# Patient Record
Sex: Female | Born: 1985 | Race: White | Hispanic: No | Marital: Married | Smoking: Never smoker
Health system: Southern US, Community
[De-identification: ages and names within clinical notes are randomized; demographics above are authoritative.]

## PROBLEM LIST (undated history)

## (undated) DIAGNOSIS — N200 Calculus of kidney: Secondary | ICD-10-CM

## (undated) HISTORY — PX: CHOLECYSTECTOMY: SHX55

---

## 2007-07-19 ENCOUNTER — Ambulatory Visit: Payer: Self-pay | Admitting: Family Medicine

## 2008-05-04 ENCOUNTER — Inpatient Hospital Stay: Payer: Self-pay

## 2010-08-31 ENCOUNTER — Ambulatory Visit: Payer: Self-pay | Admitting: Internal Medicine

## 2012-01-27 ENCOUNTER — Ambulatory Visit: Payer: Self-pay | Admitting: Emergency Medicine

## 2012-01-27 LAB — PREGNANCY, URINE: Pregnancy Test, Urine: NEGATIVE m[IU]/mL

## 2012-06-10 ENCOUNTER — Ambulatory Visit: Payer: Self-pay | Admitting: Family Medicine

## 2012-06-12 ENCOUNTER — Ambulatory Visit: Payer: Self-pay

## 2012-06-17 ENCOUNTER — Ambulatory Visit: Payer: Self-pay | Admitting: Family Medicine

## 2012-07-07 ENCOUNTER — Ambulatory Visit: Payer: Self-pay | Admitting: Family Medicine

## 2013-02-12 DIAGNOSIS — N200 Calculus of kidney: Secondary | ICD-10-CM

## 2013-02-12 HISTORY — DX: Calculus of kidney: N20.0

## 2013-09-20 ENCOUNTER — Ambulatory Visit: Payer: Self-pay | Admitting: Physician Assistant

## 2013-09-20 LAB — RAPID STREP-A WITH REFLX: Micro Text Report: POSITIVE

## 2013-11-19 ENCOUNTER — Ambulatory Visit: Payer: Self-pay

## 2013-11-19 LAB — CBC WITH DIFFERENTIAL/PLATELET
BASOS ABS: 0.1 10*3/uL (ref 0.0–0.1)
Basophil %: 0.8 %
EOS ABS: 0.2 10*3/uL (ref 0.0–0.7)
Eosinophil %: 1.7 %
HCT: 44.9 % (ref 35.0–47.0)
HGB: 15 g/dL (ref 12.0–16.0)
Lymphocyte #: 2.6 10*3/uL (ref 1.0–3.6)
Lymphocyte %: 24.1 %
MCH: 29.4 pg (ref 26.0–34.0)
MCHC: 33.4 g/dL (ref 32.0–36.0)
MCV: 88 fL (ref 80–100)
MONO ABS: 0.8 x10 3/mm (ref 0.2–0.9)
MONOS PCT: 7.7 %
Neutrophil #: 7.1 10*3/uL — ABNORMAL HIGH (ref 1.4–6.5)
Neutrophil %: 65.7 %
Platelet: 235 10*3/uL (ref 150–440)
RBC: 5.11 10*6/uL (ref 3.80–5.20)
RDW: 13.7 % (ref 11.5–14.5)
WBC: 10.8 10*3/uL (ref 3.6–11.0)

## 2013-11-19 LAB — RAPID STREP-A WITH REFLX: MICRO TEXT REPORT: NEGATIVE

## 2013-11-19 LAB — MONONUCLEOSIS SCREEN: MONO TEST: NEGATIVE

## 2013-11-21 LAB — BETA STREP CULTURE(ARMC)

## 2014-02-12 HISTORY — PX: KIDNEY STONE SURGERY: SHX686

## 2014-04-03 ENCOUNTER — Inpatient Hospital Stay: Payer: Self-pay | Admitting: Internal Medicine

## 2014-04-21 ENCOUNTER — Ambulatory Visit: Payer: Self-pay | Admitting: Urology

## 2014-06-13 NOTE — Op Note (Signed)
PATIENT NAME:  Melissa Nunez, Melissa Nunez MR#:  [id number] DATE OF BIRTH:  1985/04/13  DATE OF PROCEDURE:  04/21/2014  PREOPERATIVE DIAGNOSIS: Right proximal ureteral stone x 2.   POSTOPERATIVE DIAGNOSIS: Right proximal ureteral stone x 2.   PROCEDURE: Right ureteroscopy, laser lithotripsy, right ureteral stent exchange.   ATTENDING SURGEON: Claris Gladden, MD   ANESTHESIA: General anesthesia.   ESTIMATED BLOOD LOSS: Minimal.   DRAINS: A 6 x 24 French double-J ureteral stent on right with string in place.   SPECIMENS: Stone fragment.   COMPLICATIONS: None.   INDICATION: This is a 29 year old female who presented with acute onset right flank pain, found to have 7 and 5 mm proximal ureteral stones. She underwent urgent ureteral stent placement. She returns today to the operating room for definitive management of her stones. Risks and benefits of the procedure were explained in detail. The patient agreed to proceed as planned.   PROCEDURE: The patient was correctly identified in the preoperative holding area and informed consent was confirmed. She was brought to the operating suite and placed on the table in a supine position. At this time, universal timeout protocol was performed. All team members were identified. Venodyne boots were placed and she was administered IV ampicillin and gentamicin in the perioperative period. She was then repositioned lower on the bed in the dorsal lithotomy position and prepped and draped in surgical fashion. A rigid cystoscope using a 22 French access sheath was advanced per urethra into the bladder. Attention was turned to the right ureteral orifice from which a fairly encrusted right ureteral stent was seen emanating. The distal coil of the stent was grasped using stent graspers and brought out to the level of the urethral meatus. This was then cannulated using a Sensor wire up to the level of the kidney. The stent was removed leaving the wire in place. The wire was  snapped in place with a safety wire. Given the ureteral location of the stone, I did first attempt to use a rigid ureteroscope up to the level of the mid to proximal ureter, but I was unable to pass the scope up to the level of the kidney; therefore, a second wire was placed through the ureteroscope. A 7 French flexible ureteroscope was placed up to the level of the proximal ureter. It appears that the stone and been pushed into the mid pole calyx during this maneuver and 2 stones could be seen, one of the renal pelvis and one in the mid pole calyx. A 365 micron laser fiber was then brought in using the settings of 0.8 joules and 12 Hz. The larger of the 2 stones in the mid pole calyx was fragmented into very, very small dust-like particles. The 5 mm stone was identified in the renal pelvis and using a 1.9 French tipless Nitinol basket, this was brought out in one piece dilate the ureter without difficulty. This was passed off as stone fragment. The flexible ureteroscope was then reintroduced back up to the level of the renal pelvis after reintroducing a second working wire. Care was taken to directly visualize each and every calyx and there was no significant stone burden remaining, so small fragments in the mid pole calyx were basketed out, this time, carefully inspecting the entire length of the ureter upon removal of the scope to ensure that there were no residual stone fragments. A 6 x 24 French double-J ureteral stent was then placed over the safety wire to the level of the renal pelvis.  The wire was partially withdrawn and a coil was noted within the renal pelvis. The wire was then fully withdrawn and a coil was noted within the bladder. The string was left on the stent. The patient was cleaned and dried. The stent string was carefully secured to the patient's left inner thigh using Mastisol and Tegaderm. She was repositioned in the supine position, reversed from anesthesia, and taken to the PACU in stable  condition.   PLAN: The patient was given instructions to remove her stent in approximately 4 days. She will follow up in our office in 4 weeks with renal ultrasound prior.     ____________________________ Claris GladdenAshley J. Solana Coggin, MD ajb:bm D: 04/21/2014 12:54:35 ET T: 04/21/2014 22:54:12 ET JOB#: [id number]  cc: Claris GladdenAshley J. Money Mckeithan, MD, <Dictator> Claris GladdenASHLEY J Alexza Norbeck MD ELECTRONICALLY SIGNED 05/25/2014 17:42

## 2014-06-13 NOTE — Consult Note (Signed)
CONSULTING SERVICE: EMERGENCY ROOM AND MEDICINE   REASON FOR CONSULTATION: RIGHT PROXIMAL URETERAL STONES  OF PRESENT ILLNESS: Mrs. Melissa Nunez is a pleasant 29 YO woman who was admitted from the ED to the medicine service on 04/03/2014 when she presented with worsening pain, n/v, malaise. Bloodwork demonstrated a WBC of 13.4 and UA had some suggesstion of the presence of a UTI. CT demonstrated right proximal ureteral stone burden with hydro. SCr normal. Given concern for obstruction and infection, patient admitted to medicine service and urology was consulted for further evaluation. 04/04/14 AM, patient appears comfortable and stable. She states she has never had kidney stones before.  MEDICAL HISTORY: None.  HISTORY: None.  None.  None.  HISTORY: Grandmother has hypertension. Aunt with stones. CAD is present in the family.  HISTORY: Married, lives with husband and child. Social ETOH use. No tabacco use currently.  OF SYSTEMS: Per HPI.  EXAMINATION: mood & affect, accompanied by her husband EOMI, PERRL respirations ND, NT, no guarding/rebound, obese right > left flank tenderness, intermittently worse  WBC 13.4 on admission, downtrened to 10 after admission. SCr within normal limits. UA demosntrates presence of WBC, RBCs, and trace bacteria. H&H normal.  CT abdomen/pelvis personally reviewed. RIGHT proximal ureteral stones present with associated RIGHT hydronephrosis and a small amount of perinephric standing.  29 YO woman with right proximal ureteral stone burden. Overall, stone burden is unlikely to pass with medical expulsive therapy. UA and bloodwork demonstrates some concern for obstruction and infection. CT demonstrates moderate right hydro proximal to stone.  Given overall, clinical picture, I recommended moving forward with cysto, RPG, and RIGHT ureteral stent placement. Discussed risks/benefits including possibility of severe stone impaction preventing placement of stone. In this event, I would  recommend a nephrostomy tube. Also discussed possibility of sepsis leading to ICU level care and the remote risks of PE, MI, CVA, and death. Discussed absolute need for patient to f/u with urology to have stone treated and stent ultimately removed as an ignored stent can lead to renal loss and severe complications. She and her husband endorse understanding. All questions and concerns were addressed to their satisfaction. Informed consent signed and witnessed. Patient is eager to move forward.    Electronic Signatures for Addendum Section: Angelina PihKurpad, Vi Biddinger R (MD)  (Signed Addendum 21-Feb-16 12:11) Patient doing well after stent placement. I again reiterated the importance of f/u with patient and her husband for treatment of stone and stent removal. She should f/u with [facility]. [facility]anna ScotlandAshley Brandon [facility].   Electronic Signatures: Angelina PihKurpad, Lejon Afzal R (MD) (Signed on 21-Feb-16 10:50)  Authored   Last Updated: 21-Feb-16 12:11 by Angelina PihKurpad, Haziel Molner R (MD)

## 2014-06-13 NOTE — Discharge Summary (Signed)
PATIENT NAME:  Melissa Nunez, Melissa Nunez DATE OF BIRTH:  1985-09-03  DATE OF ADMISSION:  04/03/2014 DATE OF DISCHARGE:  04/05/2014  ADMITTING DIAGNOSES: Hydronephrosis and kidney stones.   DISCHARGE DIAGNOSES:  1.  Right proximal ureteral stone status post cystourethroscopy, right JJ ureteral stent placement, right retrograde pyelogram on 04/04/2014 by Dr. Domenick BookbinderKurpad. 2.  Right-sided hydronephrosis due to kidney stone.  3.  Pyuria likely urinary tract infection, culture is negative.  4.  Leukocytosis.  5.  Obesity with BMI of 44.7.   DISCHARGE CONDITION: Stable.   DISCHARGE MEDICATIONS: The patient is to continue hydrocodone with Tylenol 325 mg/5 mg 1 tablet every 4 hours as needed, Keflex 500 mg every 8 hours for 8 more days.   HOME OXYGEN: None.  TREATMENT:  The patient was advised to strain urine and save stone.   DIET:  Regular.  The patient was advised to stay hydrated, regular consistency.   ACTIVITY LIMITATIONS: As tolerated.    FOLLOWUP APPOINTMENTS: With Dr. Vanna ScotlandAshley Brandon, urology, in 1 week after discharge and also her primary care physician, Dr. Maryjane HurterFeldpausch in 2-3 days after discharge.  CONSULTANTS: Care management, social work, Dr. Domenick BookbinderKurpad.   RADIOLOGIC STUDIES: CT scan of the abdomen and pelvis with contrast, 04/03/2014, revealing obstructing right renal calculi in the low L3 level. There are 2 calculi measuring 3/5 mm and 5/7 mm, cholelithiasis, and IUD located lower in the uterus probably partially was in the endocervical canal.  HISTORY OF PRESENT ILLNESS:  The patient is a 29 year old Caucasian female with past medical history significant for history of obesity who presents to the hospital with complaints of abdominal pain, nausea, vomiting, as well as right flank pain. Please refer to Dr. Nicky Pughhen's admission note on 04/03/2014. On arrival to the hospital, the patient's temperature was 98.1, pulse was 94, respiration rate was 16-18, blood pressure 116/95, saturation was  98% on room air. Physical examination revealed right-sided severe tenderness, otherwise no significant abnormalities were found apart from obesity.   LABORATORY DATA: Data done on arrival to the hospital showed elevated glucose level of 106, potassium 3.1, otherwise BMP was normal. Lipase level was checked and was found to be 84. Hemoglobin A1c was 4.8. Liver enzymes were normal. CBC: White blood cell count was elevated to 13.4, hemoglobin was 13.8, platelet count was 242,000. Absolute neutrophil count was 9.2. Urine culture showed mixed but few organisms, results suggestive of contamination. Urinalysis was yellow, hazy.  Negative for glucose, bilirubin, or ketones. Specific gravity was 1.025, pH was 5.0, 3+ blood, 15 mg of protein, negative for nitrites or leukocyte esterase, 1-9 red blood cells, 9 white blood cells, trace bacteria were found. 3 epithelial cells  as well as mucus was present.   HOSPITAL COURSE:  The patient was admitted to the hospital for further evaluation. She was admitted on broad-spectrum antibiotic therapy and consultation with a urologist was obtained. Dr. Domenick BookbinderKurpad saw the patient in consultation on 04/04/2014. He recommended operative therapy. The patient had right JJ catheter replaced in ureteral area.  After the procedure she did well and did not complain of any significant discomfort and was ready to be discharged home today on 04/05/2014. She was advised to continue antibiotic therapy as well as pain medications as needed to complete the course. Her urine cultures as mentioned above were negative for urinary tract infection although the patient had pyuria and there was a significant suspicion for urinary tract infection. In regards to leukocytosis, the patient's white blood cell count normalized  with therapy. The patient was advised to continue her usual management and follow up with urologist in the next few days after discharge. Several clinical medical problems such as obesity. The  patient is to follow up with her primary physician, Dr. Maryjane Hurter.  The patient is being discharged in stable condition with the above-mentioned medications and follow-up. On the day of discharge, temperature is 97.9, pulse was 71, respirations were 19, blood pressure is 115/75.  Saturation was 97%-98% on room air at rest.  TIME SPENT:  40 minutes.   ____________________________ Katharina Caper, MD rv:mc D: 04/05/2014 16:23:24 ET T: 04/06/2014 08:40:31 ET JOB#: [id number]  cc: Katharina Caper, MD, <Dictator> Claris Gladden, MD Marina Goodell, MD  Katharina Caper MD ELECTRONICALLY SIGNED 04/27/2014 10:46

## 2014-06-13 NOTE — Op Note (Signed)
Patient: This 29 year old Female had a surgical procedure performed on 04-Apr-2014.  Post Operative Report:  Pre-Op Diagnosis RIGHT proximal ureteral stone   Post-Op Diagnosis Same   Operation Cystourethroscopy, RIGHT JJ ureteral stent placement, RIGHT retrograde pyelogram   Anesthesia General   Specimen Type Describe  RIGHT renal pelvis urine culture   Findings As Above   Surgeon Dr. Roselyn Beringaj Milta Croson   EBL: None   Complications None   Description of Procedure: OPERATIVE FINDINGS:  1. Stone visible on fluoro, mid-distal ureter 2. Moderate hydro seen on RIGHT RPG 3. Normal bladder. Both UOs seen.   PROCEDURE:  Patient was identified in the preop holding area. Consent was reviewed. Patient was then brought back to OR 10. Anesthesia was induced and patient was placed in a dorsal lithotomy position with adequate padding of all pressure points. Patient has been covered with ceftriaxone and levaquin. A pre-operative timeout was then performed.   A rigid 22Fr cystoscope was assembled and introduced into the bladder with NS irrigation and ample lubrication. Cystourethroscopy was performed. No tumors or stones were seen in the bladder and overall bladder mucosa appeared normal. Both UOs were seen. Attention was then turned to the RIGHT UO. On fluoro, a mid-distal RIGHT ureteral stone was seen corresponding to the CT scan done in the ED. The right ureter was then cannulated easily with a sensor wire and an open ended ureteral catheter was advanced to the proximal ureter. The right ureter was measured at 25cm. A renal pelvis urine culture was collected. A RIGHT RPG was performed demonstrating moderate hydro proximal to the right mid-distal ureteral stone. The sensor wire was reintroduced into the right renal pelvis and a 6x24 JJ ureteral stent was advanced and deployed. Appropriate curls were seen proximally in the right renal pelvis and distally in the bladder. Bladder was emptied. Patient was able to  tolerate the surgery without any complications. Anesthesia reversed, patient extubated, and was taken to the recovery area in stable condition.   Electronic Signatures: Angelina PihKurpad, Nollan Muldrow R (MD)  (Signed 21-Feb-16 12:10)  Authored: Patient and Date/Time, Operative Note   Last Updated: 21-Feb-16 12:10 by Angelina PihKurpad, Kanoe Wanner R (MD)

## 2014-06-13 NOTE — H&P (Signed)
PATIENT NAME:  Melissa Nunez, [name] MR#:  [id number] DATE OF BIRTH:  03/16/1985  DATE OF ADMISSION:  04/03/2014  PRIMARY CARE PHYSICIAN: Marina Goodellale E. Feldpausch, MD  REFERRING PHYSICIAN: Darien Ramusavid W. Kaminski, MD  CHIEF COMPLAINT: Abdominal pain, nausea, vomiting, and right flank pain today.   HISTORY OF PRESENT ILLNESS: A 29 year old female with no past medical history presented to the ED with abdominal pain, nausea, vomiting today. The patient is alert, awake, oriented, in no acute distress. The patient started having abdominal pain, nausea, vomiting in the afternoon, but the patient denies any fever or chills. The patient denies any dysuria, hematuria, or incontinence. The patient also complains of right flank pain. She denies any other symptoms. The patient got a CAT scan of the abdomen and pelvis, which showed kidney stone with the right hydronephrosis.   According to ED physician, urology suggested to admit the patient and then will do procedure tomorrow.   PAST MEDICAL HISTORY: None.  SURGICAL HISTORY: None.  MEDICATIONS: None.  ALLERGIES: None.  FAMILY HISTORY: Grandmother has hypertension.   REVIEW OF SYSTEMS:  CONSTITUTIONAL: The patient denies any fever or chills. No headache or dizziness or weakness.  EYES: No double vision, blurry vision. EARS, NOSE, AND THROAT: No postnasal drip, slurred speech, or dysphagia.  CARDIOVASCULAR: No chest pain, palpitation, orthopnea, or nocturnal dyspnea. No leg edema.  PULMONARY: No cough, sputum, shortness of breath, or hematemesis.  GASTROINTESTINAL: Positive for abdominal pain, nausea, vomiting. No diarrhea. No melena or bloody stool.  GENITOURINARY: No dysuria, hematuria, or incontinence.  SKIN: No rash or jaundice.  NEUROLOGY: No syncope, loss of consciousness, or seizure.  ENDOCRINE: No polyuria, polydipsia, heat or cold intolerance.  HEMATOLOGY: No easy bruising or bleeding.   PHYSICAL EXAMINATION:  VITAL SIGNS: Temperature 98.1, blood  pressure 116/95, pulse 94, oxygen saturation 98% on room air.  GENERAL: The patient is alert, awake, oriented, in no acute distress.  HEENT: Pupils round and equal and reactive to light and accommodation. Moist oral mucosa. Clear oropharynx.  NECK: Supple. No JVD or carotid bruit. No lymphadenopathy. No thyromegaly.  CARDIOVASCULAR: S1, S2, regular rate and rhythm. No murmurs or gallops.  PULMONARY: Bilateral air entry. No wheezing or rales. No use of accessory muscle to breathe.  ABDOMEN: Soft, obese. Tenderness on the right side with right side CVA tenderness. No rigidity. No rebound. No organomegaly. Bowel sounds present.  EXTREMITIES: No edema, clubbing, or cyanosis. No calf tenderness. Bilateral pedal pulses present.   SKIN: No rash or jaundice.  NEUROLOGY: A and O x 3. No focal deficit. Power 5/5. Sensory intact.   LABORATORY DATA: Glucose 106, BUN 9, creatinine 0.83. Electrolytes are normal except potassium 3.1. Liver function test is normal. WBC 13.4, hemoglobin 13.8, platelets 242,000. Urinalysis shows RBC 109, WBC 9. CAT scan of the abdomen and pelvis showed obstructing right ureteral calculi at L3, calculi measures 3 x 5 mm and 5 x 7 mm, cholelithiasis.   IMPRESSIONS:  1.  Nephrolithiasis with right hydronephrosis.  2.  Cholelithiasis.  3.  Morbidity obesity with a BMI 44.7.  4.  Hypokalemia.  5.  Leukocytosis, possibly due to mild urinary tract infection.   PLAN OF TREATMENT:  1.  The patient will be admitted to medical floor. I will start Rocephin and follow up urine culture.  2.  For obstructing nephrolithiasis and right hydronephrosis. Follow up with urology for possible procedure with tube drainage.  3.  For hypokalemia, we will give potassium supplement and follow up her potassium level and  magnesium level.   I discussed the patient's condition and plan of treatment with the patient and the patient's husband.   TIME SPENT: About 50 minutes.     ____________________________ Shaune Pollack, MD qc:bm D: 04/04/2014 00:29:07 ET T: 04/04/2014 01:43:51 ET JOB#: [id number]  cc: Shaune Pollack, MD, <Dictator> Shaune Pollack MD ELECTRONICALLY SIGNED 04/04/2014 22:08

## 2015-02-03 LAB — HM PAP SMEAR: HM Pap smear: NEGATIVE

## 2015-02-13 NOTE — L&D Delivery Note (Addendum)
Delivery Note  First Stage: Labor induction on 11/28/15 for 2 vessel cord and obesity  Labor onset: 11/29/15 at 2100 Augmentation : Pitocin AROM Analgesia /Anesthesia intrapartum:  Epidural  AROM 11/29/15 at 2013 - clear fluid   Second Stage: Complete dilation at 0823 Onset of pushing at  FHR second stage: Category 2: FHR 135 bpm/ moderate variability/ +accels/ occasional variable with pushing   Melissa LyonsCasey got the epidural around 0800 and progressed quickly to complete.  She was involuntary pushing and I was called and head was on perineum +3 station.    Delivery of a viable female "Melissa Nunez" at [phone] am by Melissa Nunez, CNM in ROA position No nuchal cord Cord double clamped after cessation of pulsation, cut by FOB Cord blood sample collected   Third Stage: Placenta delivered via Melissa BlaseSchultz intact with 2 VC @ [phone] with accessory lobe (error in original charting of umbilical cord) Placenta disposition: pathology Uterine tone firm with massage/ bleeding minimal with massage and IV Pitocin bolus going   Left labial laceration identified - hemostatic no repair   Est. Blood Loss (mL): 350mL  Mom to postpartum.  Baby to Couplet care / Skin to Skin.  Newborn: Birth Weight: 3630 gram (8#) Apgar Scores: 8, 9 Feeding planned: Breast  Melissa Nunez, CNM

## 2015-05-11 LAB — OB RESULTS CONSOLE RUBELLA ANTIBODY, IGM: Rubella: IMMUNE

## 2015-05-11 LAB — OB RESULTS CONSOLE RPR
RPR: NONREACTIVE
RPR: NONREACTIVE

## 2015-05-11 LAB — OB RESULTS CONSOLE ANTIBODY SCREEN: ANTIBODY SCREEN: NEGATIVE

## 2015-05-11 LAB — OB RESULTS CONSOLE GC/CHLAMYDIA
CHLAMYDIA, DNA PROBE: NEGATIVE
Gonorrhea: NEGATIVE

## 2015-05-11 LAB — OB RESULTS CONSOLE HIV ANTIBODY (ROUTINE TESTING): HIV: NONREACTIVE

## 2015-05-11 LAB — OB RESULTS CONSOLE ABO/RH: RH Type: POSITIVE

## 2015-05-11 LAB — OB RESULTS CONSOLE HEPATITIS B SURFACE ANTIGEN: Hepatitis B Surface Ag: NEGATIVE

## 2015-05-11 LAB — OB RESULTS CONSOLE GBS: STREP GROUP B AG: NEGATIVE

## 2015-05-11 LAB — OB RESULTS CONSOLE VARICELLA ZOSTER ANTIBODY, IGG: VARICELLA IGG: IMMUNE

## 2015-05-12 ENCOUNTER — Other Ambulatory Visit: Payer: Self-pay | Admitting: Obstetrics and Gynecology

## 2015-05-12 DIAGNOSIS — Z369 Encounter for antenatal screening, unspecified: Secondary | ICD-10-CM

## 2015-05-30 ENCOUNTER — Ambulatory Visit: Payer: Self-pay

## 2015-07-19 ENCOUNTER — Other Ambulatory Visit: Payer: Self-pay | Admitting: Obstetrics and Gynecology

## 2015-07-19 DIAGNOSIS — O4592 Premature separation of placenta, unspecified, second trimester: Secondary | ICD-10-CM

## 2015-07-25 ENCOUNTER — Ambulatory Visit
Admission: RE | Admit: 2015-07-25 | Discharge: 2015-07-25 | Disposition: A | Discharge status: Home / Self Care | Payer: BLUE CROSS/BLUE SHIELD | Source: Ambulatory Visit | Attending: Obstetrics & Gynecology | Admitting: Obstetrics & Gynecology

## 2015-07-25 VITALS — BP 131/81 | HR 75 | Temp 98.2°F | Resp 18 | Ht 64.0 in | Wt 249.0 lb

## 2015-07-25 DIAGNOSIS — O4592 Premature separation of placenta, unspecified, second trimester: Secondary | ICD-10-CM | POA: Diagnosis not present

## 2015-07-25 DIAGNOSIS — Z141 Cystic fibrosis carrier: Secondary | ICD-10-CM

## 2015-07-25 DIAGNOSIS — O09892 Supervision of other high risk pregnancies, second trimester: Secondary | ICD-10-CM | POA: Insufficient documentation

## 2015-07-25 DIAGNOSIS — IMO0001 Reserved for inherently not codable concepts without codable children: Secondary | ICD-10-CM

## 2015-07-25 DIAGNOSIS — O368920 Maternal care for other specified fetal problems, second trimester, not applicable or unspecified: Secondary | ICD-10-CM | POA: Diagnosis not present

## 2015-07-25 DIAGNOSIS — Z3A21 21 weeks gestation of pregnancy: Secondary | ICD-10-CM | POA: Diagnosis not present

## 2015-07-25 DIAGNOSIS — Z36 Encounter for antenatal screening of mother: Secondary | ICD-10-CM | POA: Insufficient documentation

## 2015-07-25 DIAGNOSIS — O99212 Obesity complicating pregnancy, second trimester: Secondary | ICD-10-CM | POA: Diagnosis present

## 2015-07-25 NOTE — Progress Notes (Signed)
[facility]  [facility]  Ms. Artis FlockWolfe was seen today at [facility] for an ultrasound in follow up to a two vessel cord [facility].  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  We spoke with the patient briefly today about this history.  She indicated that she was found to be a carrier on routine screening in her last pregnancy and her husband was tested and found to be "negative".  She was not sure about which mutation panel he had done (regular or expanded).  We spoke briefly about the reduced risk with his negative results, but that in order to accurately discuss risks to this pregnancy we would need additional information.  She indicated that she was not concerned about this history and had no further questions.  She did mention a paternal cousin with "some type of muscular dystrophy" and a nephew with autism.  Again, we explained that additional medical information would be needed to provide a risk assessment or testing options for this pregnancy.  She had her ultrasound today and declined formal genetic counseling to discuss this further.  If she learns more or has questions, we are happy to schedule a consultation due to the family history.  Dr. Tyler DeisWheeler spoke with her about the results of her ultrasound and recommendations for follow up.  We may be reached at [phone].  Cherly Andersoneborah F. Izabella Marcantel, MS, CGC

## 2015-07-29 ENCOUNTER — Telehealth: Payer: Self-pay

## 2015-07-29 NOTE — Telephone Encounter (Signed)
Left message on patient's voicemail to [facility] in regards to her fetal echo appointment.

## 2015-08-04 ENCOUNTER — Telehealth: Payer: Self-pay

## 2015-08-04 DIAGNOSIS — O09892 Supervision of other high risk pregnancies, second trimester: Secondary | ICD-10-CM

## 2015-08-04 DIAGNOSIS — Z141 Cystic fibrosis carrier: Principal | ICD-10-CM

## 2015-08-04 NOTE — Telephone Encounter (Signed)
Left message for patient to return call to clinic @ [phone].

## 2015-08-22 ENCOUNTER — Ambulatory Visit
Admission: RE | Admit: 2015-08-22 | Discharge: 2015-08-22 | Disposition: A | Discharge status: Home / Self Care | Payer: BLUE CROSS/BLUE SHIELD | Source: Ambulatory Visit | Attending: Maternal and Fetal Medicine | Admitting: Maternal and Fetal Medicine

## 2015-08-22 VITALS — BP 123/64 | HR 78 | Temp 98.4°F | Resp 17 | Ht 64.0 in | Wt 252.0 lb

## 2015-08-22 DIAGNOSIS — IMO0001 Reserved for inherently not codable concepts without codable children: Secondary | ICD-10-CM

## 2015-08-22 DIAGNOSIS — O09892 Supervision of other high risk pregnancies, second trimester: Secondary | ICD-10-CM

## 2015-08-22 DIAGNOSIS — O26892 Other specified pregnancy related conditions, second trimester: Secondary | ICD-10-CM | POA: Insufficient documentation

## 2015-08-22 DIAGNOSIS — Q270 Congenital absence and hypoplasia of umbilical artery: Secondary | ICD-10-CM | POA: Diagnosis not present

## 2015-08-22 DIAGNOSIS — Z3A25 25 weeks gestation of pregnancy: Secondary | ICD-10-CM | POA: Insufficient documentation

## 2015-08-22 DIAGNOSIS — Z141 Cystic fibrosis carrier: Secondary | ICD-10-CM

## 2015-08-22 HISTORY — DX: Calculus of kidney: N20.0

## 2015-09-15 ENCOUNTER — Other Ambulatory Visit: Payer: Self-pay

## 2015-09-15 DIAGNOSIS — IMO0001 Reserved for inherently not codable concepts without codable children: Secondary | ICD-10-CM

## 2015-09-19 ENCOUNTER — Ambulatory Visit
Admission: RE | Admit: 2015-09-19 | Discharge: 2015-09-19 | Disposition: A | Discharge status: Home / Self Care | Payer: BLUE CROSS/BLUE SHIELD | Source: Ambulatory Visit | Attending: Maternal and Fetal Medicine | Admitting: Maternal and Fetal Medicine

## 2015-09-19 VITALS — BP 124/76 | HR 86 | Temp 98.0°F | Resp 18 | Ht 64.0 in | Wt 253.0 lb

## 2015-09-19 DIAGNOSIS — Q270 Congenital absence and hypoplasia of umbilical artery: Secondary | ICD-10-CM | POA: Insufficient documentation

## 2015-09-19 DIAGNOSIS — O368930 Maternal care for other specified fetal problems, third trimester, not applicable or unspecified: Secondary | ICD-10-CM | POA: Insufficient documentation

## 2015-09-19 DIAGNOSIS — Z3A29 29 weeks gestation of pregnancy: Secondary | ICD-10-CM | POA: Insufficient documentation

## 2015-10-24 ENCOUNTER — Other Ambulatory Visit: Payer: Self-pay

## 2015-10-24 ENCOUNTER — Ambulatory Visit
Admission: RE | Admit: 2015-10-24 | Discharge: 2015-10-24 | Disposition: A | Discharge status: Home / Self Care | Payer: BLUE CROSS/BLUE SHIELD | Source: Ambulatory Visit | Attending: Obstetrics and Gynecology | Admitting: Obstetrics and Gynecology

## 2015-10-24 DIAGNOSIS — IMO0001 Reserved for inherently not codable concepts without codable children: Secondary | ICD-10-CM

## 2015-10-24 DIAGNOSIS — Z3A34 34 weeks gestation of pregnancy: Secondary | ICD-10-CM | POA: Diagnosis not present

## 2015-10-24 DIAGNOSIS — Z36 Encounter for antenatal screening of mother: Secondary | ICD-10-CM | POA: Diagnosis not present

## 2015-11-27 ENCOUNTER — Telehealth: Payer: Self-pay | Admitting: *Deleted

## 2015-11-27 ENCOUNTER — Inpatient Hospital Stay
Admission: EM | Admit: 2015-11-27 | Discharge: 2015-11-27 | Disposition: A | Discharge status: Home / Self Care | Payer: BLUE CROSS/BLUE SHIELD | Source: Home / Self Care | Admitting: Obstetrics and Gynecology

## 2015-11-27 ENCOUNTER — Encounter: Payer: Self-pay | Admitting: *Deleted

## 2015-11-27 DIAGNOSIS — Z141 Cystic fibrosis carrier: Principal | ICD-10-CM

## 2015-11-27 DIAGNOSIS — O09892 Supervision of other high risk pregnancies, second trimester: Secondary | ICD-10-CM

## 2015-11-27 NOTE — Progress Notes (Signed)
Dr Dalbert GarnetBeasley notified that pt's NST passed- Dr states pt to return 2000 tomorrow evening for Induction (due for need for cervical ripening)

## 2015-11-27 NOTE — OB Triage Provider Note (Signed)
   Triage visit for NST   Melissa Nunez is a 30 y.o. G2P1001. She is at 2595w3d gestation. She presents for a scheduled NST.  Indication: 2 vessel cord. Pt scheduled for induction of labor tonight but due to nursing staff shortage, she was rescheduled for tomorrow night.  S: Resting comfortably. no CTX, no VB. Active fetal movement.  O:  BP 127/82   Pulse 89   Temp 98.3 F (36.8 C) (Oral)   Resp 18   Ht 5\' 4"  (1.626 m)   Wt 258 lb (117 kg)   LMP 02/24/2015   BMI 44.29 kg/m  No results found for this or any previous visit (from the past 48 hour(s)).   Gen: NAD, AAOx3      Abd: FNTTP      Ext: Non-tender, Nonedmeatous    FHT: 145, mod var, +accels, no decels TOCO: quiet SVE:     A/P:  30 y.o. G2P1001 7195w3d with 2VC.    Reactive NST, with moderate variability and accelerations, no decels  Fetal Wellbeing: Reassuring  D/c home stable, precautions reviewed, follow-up as scheduled.

## 2015-11-27 NOTE — Discharge Instructions (Signed)
Pt to return 2000 hours Oct.16th, 2017 for induction of labor

## 2015-11-27 NOTE — OB Triage Note (Addendum)
Pt arrived in Triage for NST. Plans to return to [facility] for Induction in am 0800  Please note: [facility] for Induction of Labor. Pt and spouse aware

## 2015-11-28 ENCOUNTER — Inpatient Hospital Stay
Admit: 2015-11-28 | Discharge: 2015-12-01 | DRG: 775 | Disposition: A | Discharge status: Home / Self Care | Payer: BLUE CROSS/BLUE SHIELD | Attending: Obstetrics and Gynecology | Admitting: Obstetrics and Gynecology

## 2015-11-28 DIAGNOSIS — Z6790 Unspecified blood type, Rh positive: Secondary | ICD-10-CM

## 2015-11-28 DIAGNOSIS — Z141 Cystic fibrosis carrier: Secondary | ICD-10-CM | POA: Diagnosis not present

## 2015-11-28 DIAGNOSIS — O99214 Obesity complicating childbirth: Secondary | ICD-10-CM | POA: Diagnosis present

## 2015-11-28 DIAGNOSIS — E669 Obesity, unspecified: Secondary | ICD-10-CM | POA: Diagnosis present

## 2015-11-28 DIAGNOSIS — G4730 Sleep apnea, unspecified: Secondary | ICD-10-CM | POA: Diagnosis present

## 2015-11-28 DIAGNOSIS — O26893 Other specified pregnancy related conditions, third trimester: Secondary | ICD-10-CM | POA: Diagnosis present

## 2015-11-28 DIAGNOSIS — Z3483 Encounter for supervision of other normal pregnancy, third trimester: Secondary | ICD-10-CM | POA: Diagnosis present

## 2015-11-28 DIAGNOSIS — Z6841 Body Mass Index (BMI) 40.0 and over, adult: Secondary | ICD-10-CM | POA: Diagnosis not present

## 2015-11-28 DIAGNOSIS — Z3A39 39 weeks gestation of pregnancy: Secondary | ICD-10-CM

## 2015-11-28 MED ORDER — BUTORPHANOL TARTRATE 1 MG/ML IJ SOLN
1.0000 mg | INTRAMUSCULAR | Status: DC | PRN
  Administered 2015-11-30 (×3): 2 mg via INTRAVENOUS
  Filled 2015-11-28 (×3): qty 2

## 2015-11-28 MED ORDER — TERBUTALINE SULFATE 1 MG/ML IJ SOLN: 0.2500 mg | Freq: Once | INTRAMUSCULAR | Status: DC | PRN

## 2015-11-28 MED ORDER — DINOPROSTONE 10 MG VA INST
10.0000 mg | VAGINAL_INSERT | Freq: Once | VAGINAL | Status: AC
  Administered 2015-11-28: 10 mg via VAGINAL
  Filled 2015-11-28: qty 1

## 2015-11-28 MED ORDER — ZOLPIDEM TARTRATE 5 MG PO TABS
5.0000 mg | ORAL_TABLET | Freq: Every evening | ORAL | Status: DC | PRN
  Administered 2015-11-28: 5 mg via ORAL
  Filled 2015-11-28: qty 1

## 2015-11-28 NOTE — Discharge Summary (Signed)
Obstetric Discharge Summary   Patient ID: Melissa Nunez MRN: [id number] DOB/AGE: 09-07-1985 30 y.o.   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  Patient had an uncomplicated postpartum course.  By time of discharge on PPD#1, her pain was controlled on oral pain medications; she had appropriate lochia and was ambulating, voiding without difficulty and tolerating regular diet.  She was deemed stable for discharge to home.    Labs: CBC Latest Ref Rng & Units 11/19/2013  WBC 3.6 - 11.0 x10 3/mm 3 10.8  Hemoglobin 12.0 - 16.0 g/dL 28.415.0  Hematocrit 13.235.0 - 47.0 % 44.9  Platelets 150 - 440 x10 3/mm 3 235     Physical exam:  Blood pressure 114/70, pulse 79, temperature 97.5 F (36.4 C), resp. rate 17, height 5\' 4"  (1.626 m), weight 258 lb (117 kg), last menstrual period 02/24/2015. General: alert and no distress Lochia: appropriate Abdomen: soft, NT Uterine Fundus: firm Extremities: No evidence of DVT seen on physical exam. No lower extremity edema.  Discharge Instructions: Per After Visit Summary. Activity: Advance as tolerated. Pelvic rest for 6 weeks.  Also refer to After Visit Summary Diet: Regular  Outpatient follow up: [facility] in 6 weeks  Discharged Condition:stable  Discharged to: home  Newborn Data:  Baby boy named "Ronan"  Disposition  Apgars: APGAR (1 MIN): 8 APGAR (5 MINS):9 APGAR (10 MINS):  Baby Feeding: Breast  Sharee Pimplearon W Jones, CNM 11/28/2015

## 2015-11-28 NOTE — Progress Notes (Signed)
HISTORY AND PHYSICAL  HISTORY OF PRESENT ILLNESS: Ms. Acord is a 30 y.o. 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   She has not  been having contractions Q  and denies leakage of fluid, vaginal bleeding, or decreased fetal movement.  Pt had a normal fetal ECHO noted on 08/31/15  REVIEW OF SYSTEMS: A complete review of systems was performed and was specifically negative for headache, changes in vision, RUQ pain, shortness of breath, chest pain, lower extremity edema and dysuria.   HISTORY:  Past Medical History:  Diagnosis Date  . Kidney stones 2015    Past Surgical History:  Procedure Laterality Date  . KIDNEY STONE SURGERY  2016    No current facility-administered medications on file prior to encounter.    Current Outpatient Prescriptions on File Prior to Encounter  Medication Sig Dispense Refill  . cyclobenzaprine (FLEXERIL) 5 MG tablet Take 5 mg by mouth 3 (three) times daily as needed for muscle spasms. Taken for sciatica. Has not required such for several weeks    . loratadine (CLARITIN) 10 MG tablet Take 10 mg by mouth daily.    . Prenatal Vit-Fe Fumarate-FA (PRENATAL MULTIVITAMIN) TABS tablet Take 1 tablet by mouth daily at 12 noon.    . promethazine (PHENERGAN) 25 MG tablet Take 25 mg by mouth every 6 (six) hours as needed for nausea or vomiting.       No Known Allergies  OB History  Gravida Para Term Preterm AB Living  2 1 1     1   SAB TAB Ectopic Multiple Live Births          1    # Outcome Date GA Lbr Len/2nd Weight Sex Delivery Anes PTL Lv  2 Current           1 Term 05/05/08    M Vag-Spont   LIV      Gynecologic History: History of Abnormal Pap Smear: History of STI:neg  Social History  Substance Use Topics  . Smoking status: Never Smoker  . Smokeless tobacco:  Never Used  . Alcohol use No    PHYSICAL EXAM:    GENERAL: NAD AAOx3 CHEST:CTAB no increased work of breathing CV:RRR no appreciable murmurs, rubs, gallops ABDOMEN: gravid, nontender, EFW8#0 oz g by Leopolds EXTREMITIES:  Warm and well-perfused, nontender, nonedematous, 1+ DTRs neg clonus  FHTs 140 baseline with + variability, + accelerations and no  Decelerations, Cat 1  Toco: none CX: 1/50%/vtx-2  DIAGNOSTIC STUDIES: No results for input(s): WBC, HGB, HCT, PLT, NA, K, CL, CO2, BUN, CREATININE, LABGLOM, GLUCOSE, CALCIUM, BILIDIR, ALKPHOS, AST, ALT, PROT, MG in the last 168 hours.  Invalid input(s): LABALB, UA  PRENATAL STUDIES:  Prenatal Labs:  MBT: O pos ; Rubella immune, Varicella immune, HIV neg, RPR neg, Hep B neg, GC/CT neg, GBS neg,   glucola   Last [place] 11/25/15: 39  wks (67%ile) placenta above the os, AF wnl, normal anatomy  ASSESSMENT AND PLAN:  1. Fetal Well being  - Fetal Tracing:Cat 1 - Ultrasound:  reviewed, as above - Group B Streptococcus: neg - Presentation: vtx  confirmed by RN  2. Routine OB: - Prenatal labs reviewed, as above - Rh O pos  3. Induction of Labor:  -  Contractions: none on external toco in place -  Pelvis proven to 7#12oz -  Plan for induction with Cervidil 10 mg per vagina  4. Post Partum Planning: - Infant feeding: Breast - Contraception:to be determined Discussed care and management plan with Dr Feliberto GottronSchermerhorn and he looked up pt's US from Oct as previous US indicated baby in the 97%. Agreed with plan of care including Cervidil for IOL and continuous fetal and uterine monitoring.

## 2015-11-28 NOTE — OB Triage Note (Signed)
Scheduled induction of labor for 2 vessel cord and LGA fetus.

## 2015-11-29 LAB — RAPID HIV SCREEN (HIV 1/2 AB+AG)
HIV 1/2 Antibodies: NONREACTIVE
HIV-1 P24 Antigen - HIV24: NONREACTIVE

## 2015-11-29 LAB — CBC WITH DIFFERENTIAL/PLATELET
BASOS ABS: 0 10*3/uL (ref 0–0.1)
Basophils Relative: 0 %
EOS ABS: 0.1 10*3/uL (ref 0–0.7)
EOS PCT: 1 %
HCT: 36.1 % (ref 35.0–47.0)
Hemoglobin: 12.5 g/dL (ref 12.0–16.0)
LYMPHS ABS: 2.2 10*3/uL (ref 1.0–3.6)
Lymphocytes Relative: 20 %
MCH: 29.9 pg (ref 26.0–34.0)
MCHC: 34.7 g/dL (ref 32.0–36.0)
MCV: 86.3 fL (ref 80.0–100.0)
Monocytes Absolute: 1.3 10*3/uL — ABNORMAL HIGH (ref 0.2–0.9)
Monocytes Relative: 12 %
Neutro Abs: 7.2 10*3/uL — ABNORMAL HIGH (ref 1.4–6.5)
Neutrophils Relative %: 67 %
PLATELETS: 173 10*3/uL (ref 150–440)
RBC: 4.18 MIL/uL (ref 3.80–5.20)
RDW: 13.5 % (ref 11.5–14.5)
WBC: 10.9 10*3/uL (ref 3.6–11.0)

## 2015-11-29 LAB — TYPE AND SCREEN
ABO/RH(D): O POS
Antibody Screen: NEGATIVE

## 2015-11-29 MED ORDER — SODIUM CHLORIDE FLUSH 0.9 % IV SOLN
INTRAVENOUS | Status: AC
  Filled 2015-11-29: qty 10

## 2015-11-29 MED ORDER — OXYTOCIN 40 UNITS IN LACTATED RINGERS INFUSION - SIMPLE MED
1.0000 m[IU]/min | INTRAVENOUS | Status: DC
  Administered 2015-11-29: 1 m[IU]/min via INTRAVENOUS
  Filled 2015-11-29: qty 1000

## 2015-11-29 MED ORDER — LACTATED RINGERS IV SOLN
INTRAVENOUS | Status: DC
  Administered 2015-11-30 (×2): via INTRAVENOUS

## 2015-11-29 MED ORDER — TERBUTALINE SULFATE 1 MG/ML IJ SOLN: 0.2500 mg | Freq: Once | INTRAMUSCULAR | Status: DC | PRN

## 2015-11-29 MED ORDER — DINOPROSTONE 10 MG VA INST
10.0000 mg | VAGINAL_INSERT | Freq: Once | VAGINAL | Status: DC
  Filled 2015-11-29 (×2): qty 1

## 2015-11-29 NOTE — Progress Notes (Signed)
Patient ID: Melissa Nunez, female   DOB: 01-May-1985, 30 y.o.   MRN: [id number] Induction day 1 with cervidl placement last pm 2400 . Marland Kitchen. 2V cord and obesity . Last u/s 10/13  AGA baby . VSS  Reassuring fetal monitoring  Cervidil removed . Cx : 1/ 50 / OOP vtx  A: induction  P: small meal and replace cervidil  scd for legs

## 2015-11-29 NOTE — Progress Notes (Signed)
Melissa Nunez is a 30 y.o. G2P1001 at 8146w5d by induction day 2  Subjective: Mildly pain ctx   Objective: BP 121/63 (BP Location: Right Arm)   Pulse 75   Temp 98.2 F (36.8 C) (Oral)   Resp 17   Ht 5\' 4"  (1.626 m)   Wt 258 lb (117 kg)   LMP 02/24/2015   BMI 44.29 kg/m  No intake/output data recorded. No intake/output data recorded.  FHT:  FHR: 150 bpm, variability: minimal ,  accelerations:  Present,  decelerations:  Absent UC:   irregular SVE:   Dilation: 3.5 Effacement (%): 90 Station: -2 Exam by:: TJS AROM : clear . IUPC placed  Labs: Lab Results  Component Value Date   WBC 10.9 11/28/2015   HGB 12.5 11/28/2015   HCT 36.1 11/28/2015   MCV 86.3 11/28/2015   PLT 173 11/28/2015    Assessment / Plan: Induction , day #2 Start Pitocin augmentation for inadequate CTX pattern  SCHERMERHORN,THOMAS 11/29/2015, 8:17 PM

## 2015-11-30 ENCOUNTER — Encounter: Payer: Self-pay | Admitting: *Deleted

## 2015-11-30 ENCOUNTER — Inpatient Hospital Stay: Payer: BLUE CROSS/BLUE SHIELD | Admitting: Anesthesiology

## 2015-11-30 LAB — CBC
HCT: 35 % (ref 35.0–47.0)
HEMOGLOBIN: 11.8 g/dL — AB (ref 12.0–16.0)
MCH: 29.1 pg (ref 26.0–34.0)
MCHC: 33.7 g/dL (ref 32.0–36.0)
MCV: 86.3 fL (ref 80.0–100.0)
Platelets: 142 10*3/uL — ABNORMAL LOW (ref 150–440)
RBC: 4.06 MIL/uL (ref 3.80–5.20)
RDW: 13.6 % (ref 11.5–14.5)
WBC: 14.1 10*3/uL — AB (ref 3.6–11.0)

## 2015-11-30 MED ORDER — LACTATED RINGERS IV SOLN: 500.0000 mL | INTRAVENOUS | Status: DC | PRN

## 2015-11-30 MED ORDER — BENZOCAINE-MENTHOL 20-0.5 % EX AERO
1.0000 "application " | INHALATION_SPRAY | CUTANEOUS | Status: DC | PRN
  Administered 2015-11-30: 1 via TOPICAL
  Filled 2015-11-30: qty 56

## 2015-11-30 MED ORDER — FENTANYL 2.5 MCG/ML W/ROPIVACAINE 0.2% IN NS 100 ML EPIDURAL INFUSION (ARMC-ANES)
10.0000 mL/h | EPIDURAL | Status: DC
  Administered 2015-11-30: 10 mL/h via EPIDURAL

## 2015-11-30 MED ORDER — IBUPROFEN 600 MG PO TABS
600.0000 mg | ORAL_TABLET | Freq: Four times a day (QID) | ORAL | Status: DC
  Administered 2015-11-30 – 2015-12-01 (×5): 600 mg via ORAL
  Filled 2015-11-30 (×5): qty 1

## 2015-11-30 MED ORDER — ACETAMINOPHEN 325 MG PO TABS
650.0000 mg | ORAL_TABLET | ORAL | Status: DC | PRN
  Administered 2015-11-30: 650 mg via ORAL
  Filled 2015-11-30: qty 2

## 2015-11-30 MED ORDER — FENTANYL 2.5 MCG/ML W/ROPIVACAINE 0.2% IN NS 100 ML EPIDURAL INFUSION (ARMC-ANES)
EPIDURAL | Status: AC
  Filled 2015-11-30: qty 100

## 2015-11-30 MED ORDER — SODIUM CHLORIDE 0.9% FLUSH
3.0000 mL | INTRAVENOUS | Status: DC | PRN
  Administered 2015-11-30: 3 mL via INTRAVENOUS
  Filled 2015-11-30: qty 3

## 2015-11-30 MED ORDER — SENNOSIDES-DOCUSATE SODIUM 8.6-50 MG PO TABS
2.0000 | ORAL_TABLET | ORAL | Status: DC
  Administered 2015-12-01: 2 via ORAL
  Filled 2015-11-30: qty 2

## 2015-11-30 MED ORDER — BUPIVACAINE HCL (PF) 0.25 % IJ SOLN
INTRAMUSCULAR | Status: DC | PRN
  Administered 2015-11-30: 4 mL via EPIDURAL
  Administered 2015-11-30: 5 mL via EPIDURAL

## 2015-11-30 MED ORDER — OXYTOCIN 40 UNITS IN LACTATED RINGERS INFUSION - SIMPLE MED: 2.5000 [IU]/h | INTRAVENOUS | Status: DC

## 2015-11-30 MED ORDER — DIPHENHYDRAMINE HCL 50 MG/ML IJ SOLN: 12.5000 mg | INTRAMUSCULAR | Status: DC | PRN

## 2015-11-30 MED ORDER — LIDOCAINE HCL (PF) 1 % IJ SOLN
INTRAMUSCULAR | Status: DC | PRN
  Administered 2015-11-30: 3 mL

## 2015-11-30 MED ORDER — OXYTOCIN BOLUS FROM INFUSION
500.0000 mL | Freq: Once | INTRAVENOUS | Status: AC
  Administered 2015-11-30: 500 mL via INTRAVENOUS

## 2015-11-30 MED ORDER — ONDANSETRON HCL 4 MG PO TABS: 4.0000 mg | ORAL_TABLET | ORAL | Status: DC | PRN

## 2015-11-30 MED ORDER — DIPHENHYDRAMINE HCL 25 MG PO CAPS
25.0000 mg | ORAL_CAPSULE | Freq: Four times a day (QID) | ORAL | Status: DC | PRN
Start: 2015-11-30 — End: 2015-12-02

## 2015-11-30 MED ORDER — PRENATAL MULTIVITAMIN CH
1.0000 | ORAL_TABLET | Freq: Every day | ORAL | Status: DC
  Administered 2015-11-30 – 2015-12-01 (×2): 1 via ORAL
  Filled 2015-11-30 (×2): qty 1

## 2015-11-30 MED ORDER — SODIUM CHLORIDE FLUSH 0.9 % IV SOLN
INTRAVENOUS | Status: AC
  Administered 2015-11-30: 3 mL via INTRAVENOUS
  Filled 2015-11-30: qty 10

## 2015-11-30 MED ORDER — NALBUPHINE HCL 10 MG/ML IJ SOLN: 5.0000 mg | INTRAMUSCULAR | Status: DC | PRN

## 2015-11-30 MED ORDER — NALBUPHINE HCL 10 MG/ML IJ SOLN: 5.0000 mg | Freq: Once | INTRAMUSCULAR | Status: DC | PRN

## 2015-11-30 MED ORDER — ZOLPIDEM TARTRATE 5 MG PO TABS: 5.0000 mg | ORAL_TABLET | Freq: Every evening | ORAL | Status: DC | PRN

## 2015-11-30 MED ORDER — OXYCODONE-ACETAMINOPHEN 5-325 MG PO TABS: 1.0000 | ORAL_TABLET | ORAL | Status: DC | PRN

## 2015-11-30 MED ORDER — LACTATED RINGERS IV SOLN
INTRAVENOUS | Status: DC
  Administered 2015-11-30: 08:00:00 via INTRAVENOUS

## 2015-11-30 MED ORDER — NALOXONE HCL 0.4 MG/ML IJ SOLN: 0.4000 mg | INTRAMUSCULAR | Status: DC | PRN

## 2015-11-30 MED ORDER — LIDOCAINE-EPINEPHRINE (PF) 1.5 %-1:200000 IJ SOLN
INTRAMUSCULAR | Status: DC | PRN
  Administered 2015-11-30: 3 mL via EPIDURAL

## 2015-11-30 MED ORDER — DIPHENHYDRAMINE HCL 25 MG PO CAPS: 25.0000 mg | ORAL_CAPSULE | ORAL | Status: DC | PRN

## 2015-11-30 MED ORDER — ONDANSETRON HCL 4 MG/2ML IJ SOLN: 4.0000 mg | INTRAMUSCULAR | Status: DC | PRN

## 2015-11-30 MED ORDER — FERROUS SULFATE 325 (65 FE) MG PO TABS
325.0000 mg | ORAL_TABLET | Freq: Two times a day (BID) | ORAL | Status: DC
  Administered 2015-12-01 (×2): 325 mg via ORAL
  Filled 2015-11-30 (×2): qty 1

## 2015-11-30 MED ORDER — WITCH HAZEL-GLYCERIN EX PADS
1.0000 | MEDICATED_PAD | CUTANEOUS | Status: DC | PRN
Start: 2015-11-30 — End: 2015-12-02

## 2015-11-30 MED ORDER — OXYCODONE-ACETAMINOPHEN 5-325 MG PO TABS: 2.0000 | ORAL_TABLET | ORAL | Status: DC | PRN

## 2015-11-30 MED ORDER — NALOXONE HCL 2 MG/2ML IJ SOSY
1.0000 ug/kg/h | PREFILLED_SYRINGE | INTRAVENOUS | Status: DC | PRN
  Filled 2015-11-30: qty 2

## 2015-11-30 MED ORDER — COCONUT OIL OIL
1.0000 "application " | TOPICAL_OIL | Status: DC | PRN
  Administered 2015-12-01: 1 via TOPICAL
  Filled 2015-11-30: qty 120

## 2015-11-30 MED ORDER — DIBUCAINE 1 % RE OINT: 1.0000 "application " | TOPICAL_OINTMENT | RECTAL | Status: DC | PRN

## 2015-11-30 MED ORDER — SENNOSIDES-DOCUSATE SODIUM 8.6-50 MG PO TABS: 2.0000 | ORAL_TABLET | ORAL | Status: DC

## 2015-11-30 MED ORDER — SIMETHICONE 80 MG PO CHEW: 80.0000 mg | CHEWABLE_TABLET | ORAL | Status: DC | PRN

## 2015-11-30 NOTE — Anesthesia Preprocedure Evaluation (Signed)
Anesthesia Evaluation  Patient identified by MRN, date of birth, ID band Patient awake    Reviewed: Allergy & Precautions, NPO status , Patient's Chart, lab work & pertinent test results  History of Anesthesia Complications Negative for: history of anesthetic complications  Airway Mallampati: II   Neck ROM: full    Dental  (+) Teeth Intact   Pulmonary neg pulmonary ROS,    Pulmonary exam normal        Cardiovascular Exercise Tolerance: Good negative cardio ROS Normal cardiovascular exam     Neuro/Psych negative neurological ROS     GI/Hepatic negative GI ROS, Neg liver ROS,   Endo/Other  negative endocrine ROS  Renal/GU Kidney stones     Musculoskeletal   Abdominal   Peds  Hematology negative hematology ROS (+)   Anesthesia Other Findings   Reproductive/Obstetrics                             Anesthesia Physical Anesthesia Plan  ASA: II  Anesthesia Plan: Epidural   Post-op Pain Management:    Induction:   Airway Management Planned:   Additional Equipment:   Intra-op Plan:   Post-operative Plan:   Informed Consent: I have reviewed the patients History and Physical, chart, labs and discussed the procedure including the risks, benefits and alternatives for the proposed anesthesia with the patient or authorized representative who has indicated his/her understanding and acceptance.   Dental Advisory Given  Plan Discussed with:   Anesthesia Plan Comments:         Anesthesia Quick Evaluation

## 2015-11-30 NOTE — Progress Notes (Signed)
Patient ID: Melissa Nunez, female   DOB: [birth year], 30 y.o.   MRN: [id number] Pit augmentation , reassuring fetal monitoring

## 2015-11-30 NOTE — H&P (Signed)
HISTORY AND PHYSICAL  HISTORY OF PRESENT ILLNESS: Ms. Melissa Nunez is a 30 y.o. 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   She has not  been having contractions Q  and denies leakage of fluid, vaginal bleeding, or decreased fetal movement.  Pt had a normal fetal ECHO noted on 08/31/15  REVIEW OF SYSTEMS: A complete review of systems was performed and was specifically negative for headache, changes in vision, RUQ pain, shortness of breath, chest pain, lower extremity edema and dysuria.   HISTORY:      Past Medical History:  Diagnosis Date  . Kidney stones 2015         Past Surgical History:  Procedure Laterality Date  . KIDNEY STONE SURGERY  2016    No current facility-administered medications on file prior to encounter.    Current Outpatient Prescriptions on File Prior to Encounter  Medication Sig Dispense Refill  . cyclobenzaprine (FLEXERIL) 5 MG tablet Take 5 mg by mouth 3 (three) times daily as needed for muscle spasms. Taken for sciatica. Has not required such for several weeks    . loratadine (CLARITIN) 10 MG tablet Take 10 mg by mouth daily.    . Prenatal Vit-Fe Fumarate-FA (PRENATAL MULTIVITAMIN) TABS tablet Take 1 tablet by mouth daily at 12 noon.    . promethazine (PHENERGAN) 25 MG tablet Take 25 mg by mouth every 6 (six) hours as needed for nausea or vomiting.       No Known Allergies                  OB History  Gravida Para Term Preterm AB Living  2 1 1     1   SAB TAB Ectopic Multiple Live Births          1    # Outcome Date GA Lbr Len/2nd Weight Sex Delivery Anes PTL Lv  2 Current           1 Term 05/05/08    M Vag-Spont   LIV      Gynecologic History: History of Abnormal Pap Smear: History of STI:neg      Social History   Substance Use Topics  . Smoking status: Never Smoker  . Smokeless tobacco: Never Used  . Alcohol use No    PHYSICAL EXAM:    GENERAL: NAD AAOx3 CHEST:CTAB no increased work of breathing CV:RRR no appreciable murmurs, rubs, gallops ABDOMEN: gravid, nontender, EFW8#0 oz g by Leopolds EXTREMITIES:  Warm and well-perfused, nontender, nonedematous, 1+ DTRs neg clonus  FHTs 140 baseline with + variability, + accelerations and no  Decelerations, Cat 1  Toco: none CX: 1/50%/vtx-2  DIAGNOSTIC STUDIES:  Last Labs   No results for input(s): WBC, HGB, HCT, PLT, NA, K, CL, CO2, BUN, CREATININE, LABGLOM, GLUCOSE, CALCIUM, BILIDIR, ALKPHOS, AST, ALT, PROT, MG in the last 168 hours.  Invalid input(s): LABALB, UA    PRENATAL STUDIES:  Prenatal Labs:  MBT: O pos ; Rubella immune, Varicella immune, HIV neg, RPR neg, Hep B neg, GC/CT neg, GBS neg,   glucola   Last US 11/25/15: 39  wks (67%ile) placenta above the os, AF wnl, normal anatomy  ASSESSMENT AND PLAN:  1. Fetal Well being  - Fetal Tracing:Cat 1 - Ultrasound:  reviewed, as above - Group B Streptococcus: neg -  Presentation: vtx  confirmed by RN  2. Routine OB: - Prenatal labs reviewed, as above - Rh O pos  3. Induction of Labor:  -  Contractions: none on external toco in place -  Pelvis proven to 7#12oz -  Plan for induction with Cervidil 10 mg per vagina  4. Post Partum Planning: - Infant feeding: Breast - Contraception:to be determined Discussed care and management plan with Dr Feliberto Gottron and he looked up pt's [place] from Oct as previous US indicated baby in the 97%. Agreed with plan of care including Cervidil for IOL and continuous fetal and uterine monitoring.   Carlean Jews, CNM

## 2015-12-01 LAB — CBC
HCT: 33.5 % — ABNORMAL LOW (ref 35.0–47.0)
Hemoglobin: 11.3 g/dL — ABNORMAL LOW (ref 12.0–16.0)
MCH: 29.8 pg (ref 26.0–34.0)
MCHC: 33.7 g/dL (ref 32.0–36.0)
MCV: 88.3 fL (ref 80.0–100.0)
PLATELETS: 169 10*3/uL (ref 150–440)
RBC: 3.79 MIL/uL — ABNORMAL LOW (ref 3.80–5.20)
RDW: 14 % (ref 11.5–14.5)
WBC: 15.8 10*3/uL — AB (ref 3.6–11.0)

## 2015-12-01 LAB — SURGICAL PATHOLOGY

## 2015-12-01 MED ORDER — IBUPROFEN 800 MG PO TABS: 800.0000 mg | ORAL_TABLET | Freq: Three times a day (TID) | ORAL | 0 refills | Status: AC | PRN

## 2015-12-01 NOTE — Progress Notes (Signed)
D/C instructions provided verbally pt stated understanding. Pt aware of follow up appt.

## 2015-12-01 NOTE — Discharge Summary (Signed)
Reviewed D/C instructions with patient and significant other including when to call the MD, f/u appointment instructions, physical restrictions, prescriptions, and postpartum care at home.  Addressed patient questions as needed.  Obtained a signed copy of the D/C instructions for the patient file and provided a signed copy to the patient.  Discharged patient home via wheelchair escorted by nursing staff. 

## 2015-12-01 NOTE — Progress Notes (Signed)
PPD #1, SVD, baby boy "Ronan"  S:  Reports feeling good, slept some overnight, desires to go home today if possible             Tolerating po/ No nausea or vomiting             Bleeding is light             Pain controlled with Motrin and Tylenol             Up ad lib / ambulatory / voiding QS  Newborn breast feeding  Going well  Peds planning for circumcision today - if stable, okay to be discharged   O:               VS: BP 117/64 (BP Location: Left Arm)   Pulse 67   Temp 98.4 F (36.9 C) (Oral)   Resp 18   Ht 5\' 4"  (1.626 m)   Wt 117 kg (258 lb)   LMP 02/24/2015   SpO2 99%   Breastfeeding? Unknown   BMI 44.29 kg/m    LABS:              Recent Labs  11/30/15 0613 12/01/15 0435  WBC 14.1* 15.8*  HGB 11.8* 11.3*  PLT 142* 169               Blood type: --/--/O POS (10/16 2036)  Rubella: Immune (03/29 0000)                     I&O: Intake/Output    None                 Physical Exam:             Alert and oriented X3  Lungs: Clear and unlabored  Heart: regular rate and rhythm / no mumurs  Abdomen: soft, non-tender, non-distended              Fundus: firm, non-tender, U-2   Perineum: healing well, no significant edema or erythema  Lochia: appropraite  Extremities: no edema, no calf pain or tenderness    A: PPD # 1    Doing well - stable status  P: Routine post partum orders  Continue current care  Anticipate late discharge today   Carlean JewsMeredith Laurell Coalson, CNM

## 2015-12-01 NOTE — Anesthesia Postprocedure Evaluation (Signed)
Anesthesia Post Note  Patient: Melissa Nunez  Procedure(s) Performed: * No procedures listed *  Patient location during evaluation: Mother Baby Anesthesia Type: Epidural Level of consciousness: awake and alert Pain management: pain level controlled Vital Signs Assessment: post-procedure vital signs reviewed and stable Respiratory status: spontaneous breathing, nonlabored ventilation and respiratory function stable Cardiovascular status: stable Postop Assessment: no headache, no backache and patient able to bend at knees Anesthetic complications: no    Last Vitals:  Vitals:   12/01/15 0727 12/01/15 1118  BP: 117/64 115/60  Pulse: 67 60  Resp: 18 18  Temp: 36.9 C 36.7 C    Last Pain:  Vitals:   12/01/15 1118  TempSrc: Oral  PainSc:                  Cleda MccreedyJoseph K Sahej Hauswirth

## 2015-12-01 NOTE — Discharge Instructions (Signed)

## 2015-12-02 LAB — RPR: RPR Ser Ql: NONREACTIVE

## 2016-01-15 ENCOUNTER — Encounter: Payer: Self-pay | Admitting: Obstetrics and Gynecology

## 2016-08-16 DIAGNOSIS — F53 Postpartum depression: Secondary | ICD-10-CM | POA: Insufficient documentation

## 2016-08-16 DIAGNOSIS — J301 Allergic rhinitis due to pollen: Secondary | ICD-10-CM | POA: Insufficient documentation

## 2016-10-12 IMAGING — US US MFM OB FOLLOW-UP
1 series · 14 of 28 positions shown · non-contrast
Comparison: none

[Series 1: us mfm ob follow-up · 0.20mm/px · 14 of 34 slices shown]
[im 2/34]
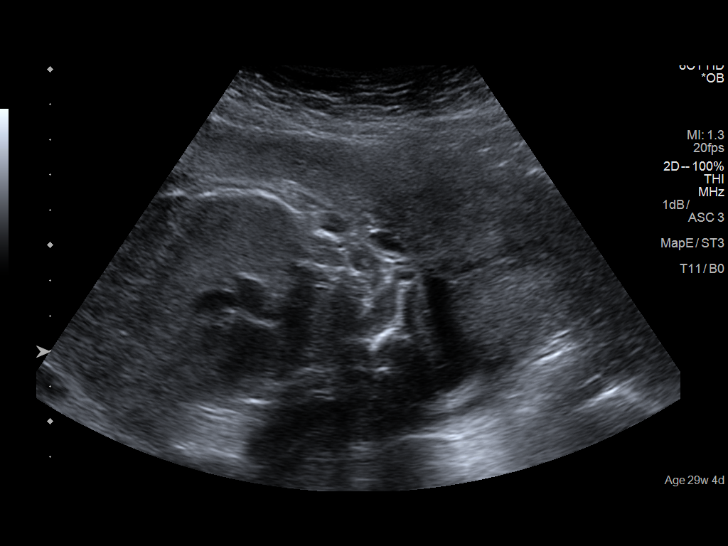
[im 4/34]
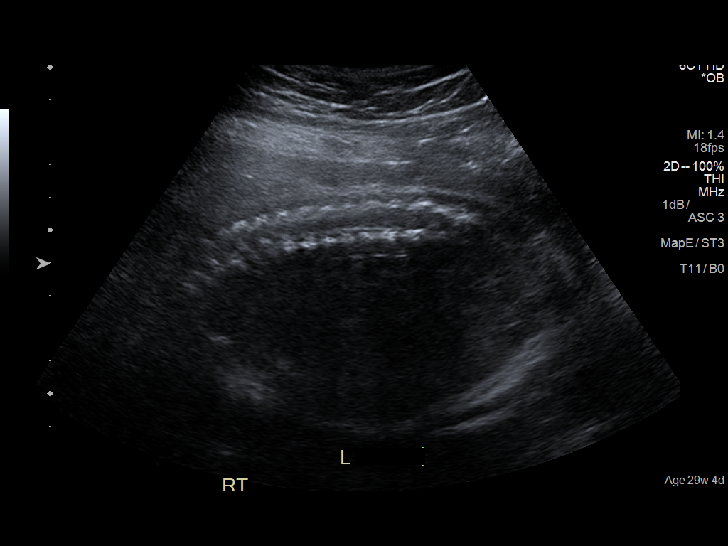
[im 7/34]
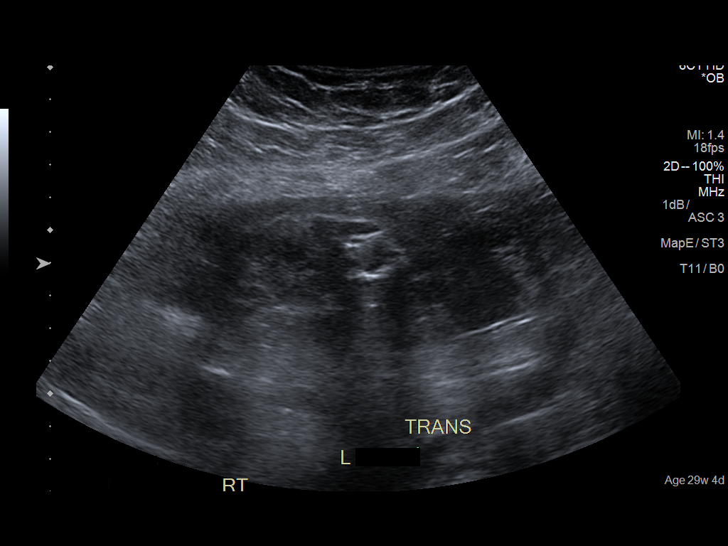
[im 9/34]
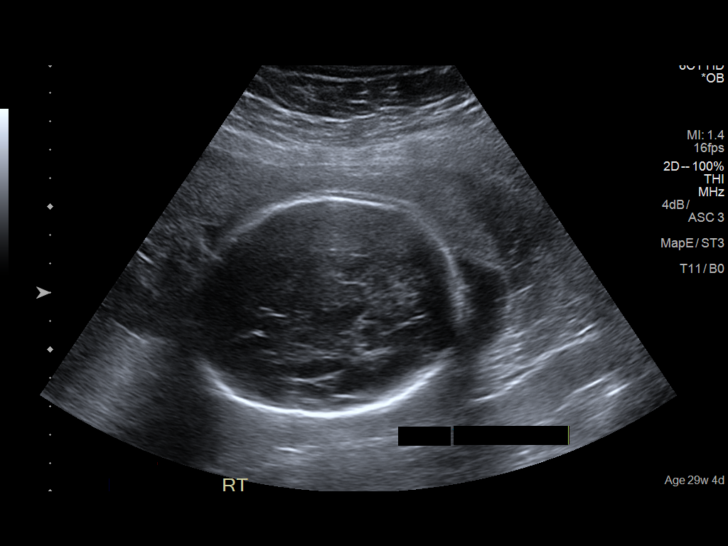
[im 12/34]
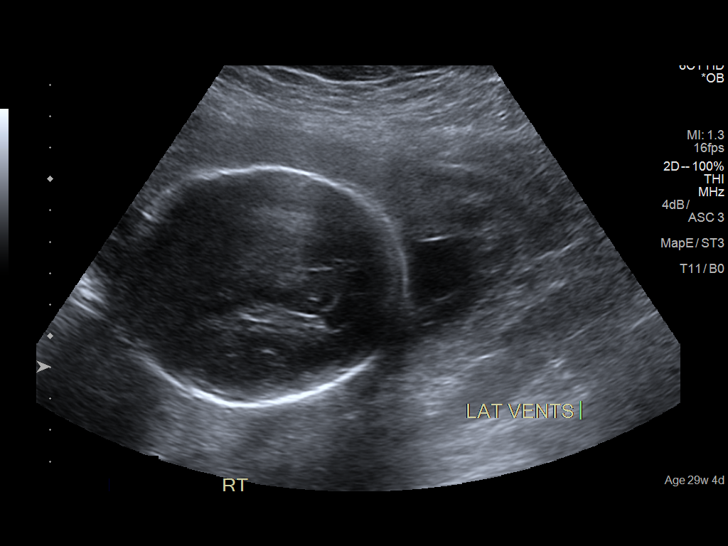
[im 14/34]
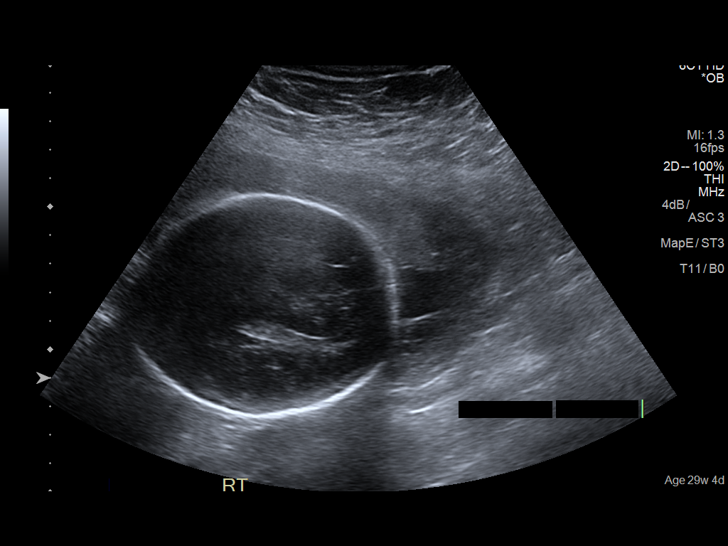
[im 16/34]
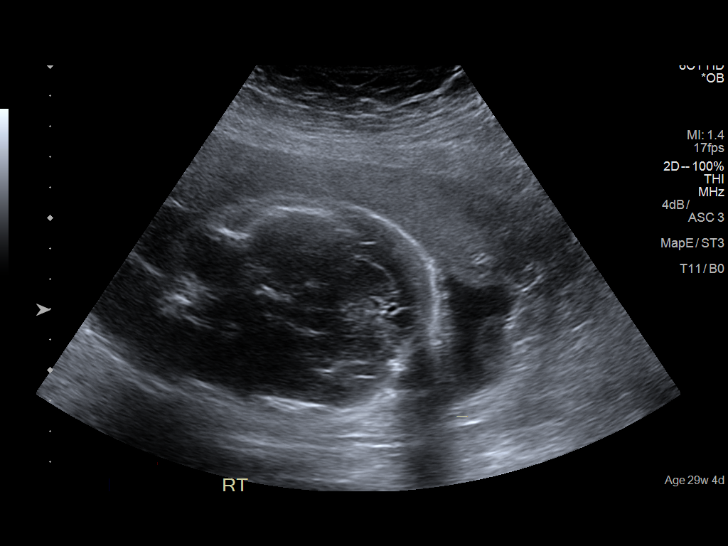
[im 19/34]
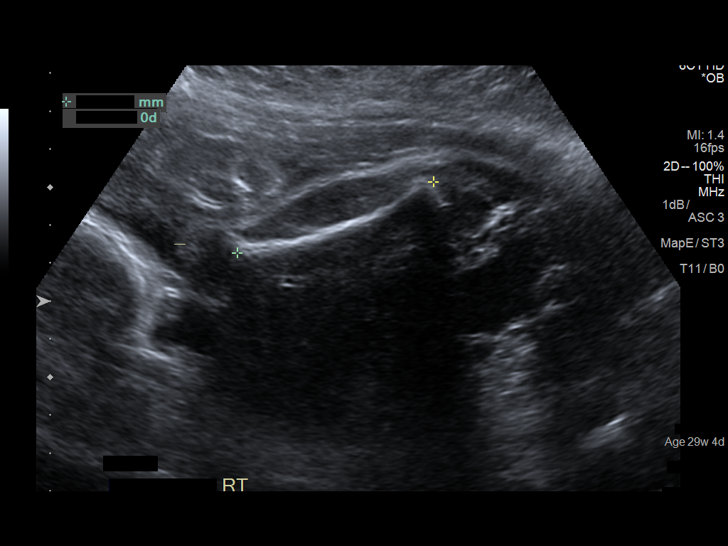
[im 21/34]
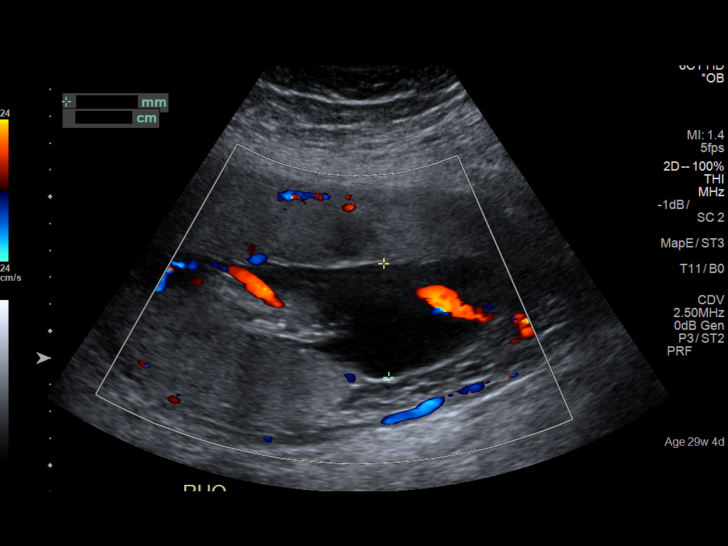
[im 24/34]
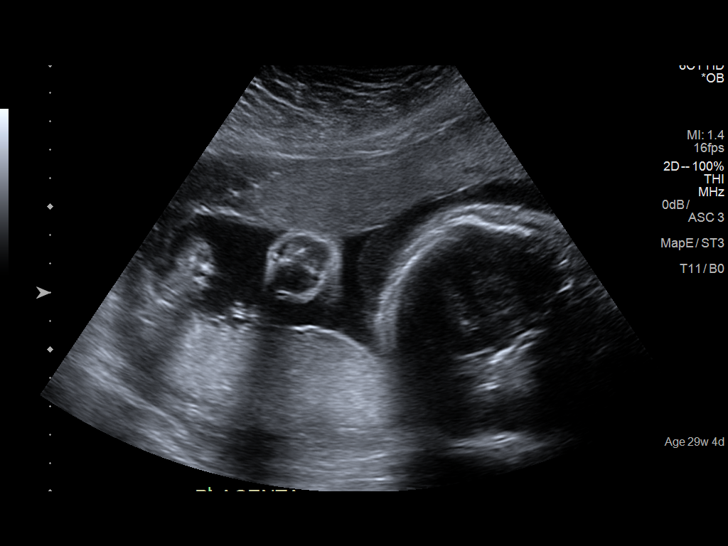
[im 26/34]
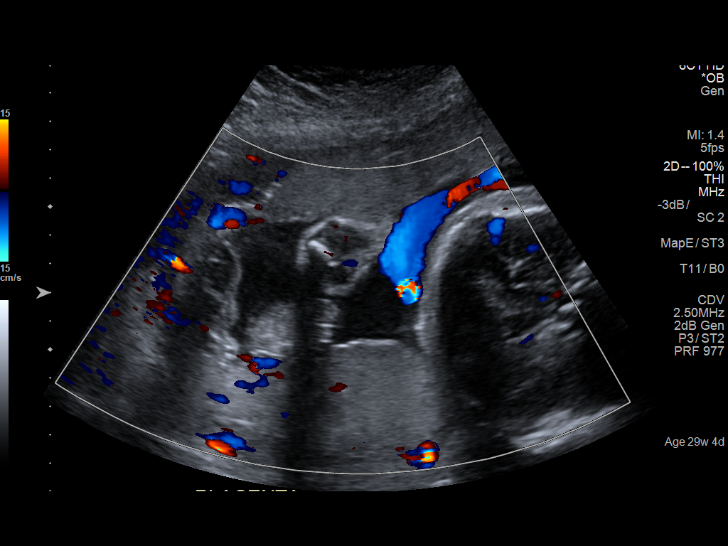
[im 29/34]
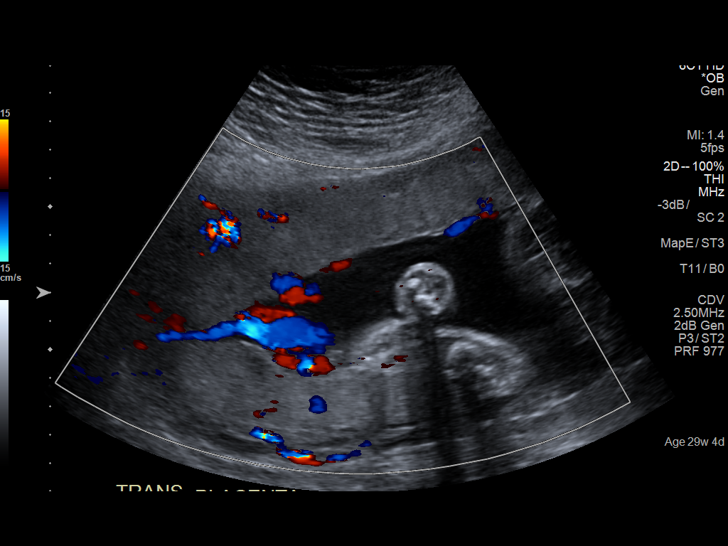
[im 31/34]
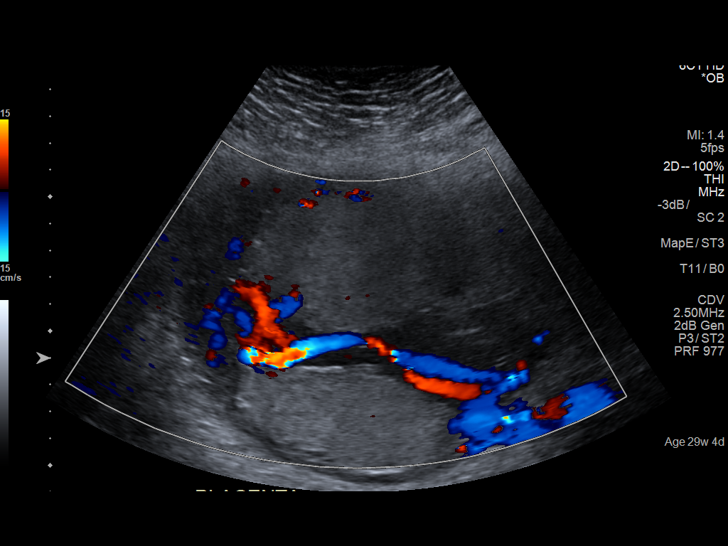
[im 34/34]
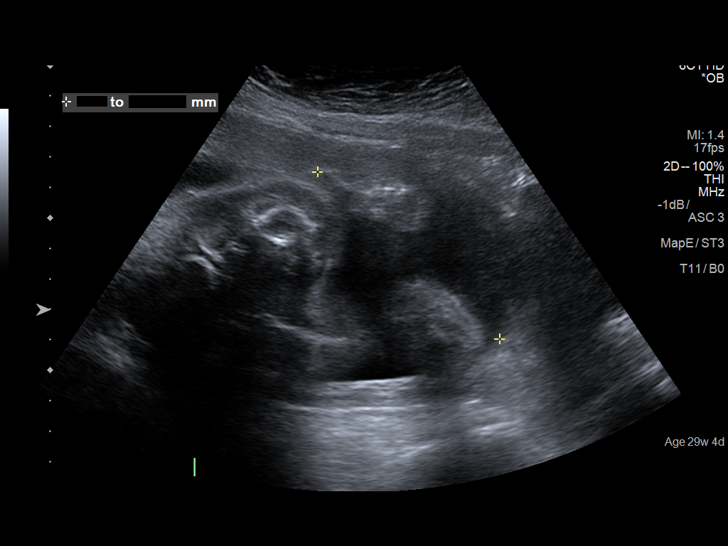

[14 of 28 positions shown; findings below may reference images not displayed]

Canned report from images found in remote index.

Refer to host system for actual result text.

## 2017-04-02 ENCOUNTER — Encounter: Payer: Self-pay | Admitting: Emergency Medicine

## 2017-04-02 ENCOUNTER — Ambulatory Visit
Admission: EM | Admit: 2017-04-02 | Discharge: 2017-04-02 | Disposition: A | Discharge status: Home / Self Care | Payer: BLUE CROSS/BLUE SHIELD | Attending: Internal Medicine | Admitting: Internal Medicine

## 2017-04-02 ENCOUNTER — Other Ambulatory Visit: Payer: Self-pay

## 2017-04-02 DIAGNOSIS — J40 Bronchitis, not specified as acute or chronic: Secondary | ICD-10-CM | POA: Diagnosis not present

## 2017-04-02 NOTE — ED Provider Notes (Signed)
[facility]    CSN: [id number] Arrival date & time: 04/02/17  1309     History   Chief Complaint Chief Complaint  Patient presents with  . Cough    HPI Melissa Nunez is a 32 y.o. female.   32 yo female c/o cough. Started as a head cold and now she states that her congestion has moved to her chest. Denies SOB or fever. Admits that sputum is thick and occasionally green. PMH is for cystic fibrosis CARRIER (no disease); no chronic lung problems.       Past Medical History:  Diagnosis Date  . Kidney stones 2015    Patient Active Problem List   Diagnosis Date Noted  . Labor and delivery, indication for care 11/30/2015  . Indication for care in labor and delivery, antepartum 11/28/2015  . 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 KIDNEY STONE SURGERY  2016    OB History    Gravida Para Term Preterm AB Living   2 2 2     1    SAB TAB Ectopic Multiple Live Births         0 1      Obstetric Comments   2 vessel cord        Home Medications    Prior to Admission medications   Medication Sig Start Date End Date Taking? Authorizing Provider  cyclobenzaprine (FLEXERIL) 5 MG tablet Take 5 mg by mouth 3 (three) times daily as needed for muscle spasms. Taken for sciatica. Has not required such for several weeks    [provider]  loratadine (CLARITIN) 10 MG tablet Take 10 mg by mouth daily.    [provider]  Prenatal Vit-Fe Fumarate-FA (PRENATAL MULTIVITAMIN) TABS tablet Take 1 tablet by mouth daily at 12 noon.    [provider]  promethazine (PHENERGAN) 25 MG tablet Take 25 mg by mouth every 6 (six) hours as needed for nausea or vomiting.    [provider]    Family History Family History  Problem Relation Age of Onset  . Hypertension Mother   . Bone cancer Mother        jaw  . Hypertension Father   . Diabetes Father      Social History Social History   Tobacco Use  . Smoking status: Never Smoker  . Smokeless tobacco: Never Used  Substance Use Topics  . Alcohol use: No  . Drug use: No     Allergies   Patient has no known allergies.   Review of Systems Review of Systems  Constitutional: Negative for chills and fever.  HENT: Negative for sore throat and tinnitus.   Eyes: Negative for redness.  Respiratory: Positive for cough. Negative for shortness of breath.   Cardiovascular: Negative for chest pain and palpitations.  Gastrointestinal: Negative for abdominal pain, diarrhea, nausea and vomiting.  Genitourinary: Negative for dysuria, frequency and urgency.  Musculoskeletal: Negative for myalgias.  Skin: Negative for rash.       No lesions  Neurological: Negative for weakness.  Hematological: Does not bruise/bleed easily.  Psychiatric/Behavioral: Negative for suicidal ideas.     Physical Exam Triage Vital Signs ED Triage Vitals [04/02/17 1333]  Enc Vitals Group     BP (!) 153/95     Pulse Rate 100     Resp 16     Temp 98.4 F (36.9 C)     Temp Source Oral  SpO2 100 %     Weight 270 lb (122.5 kg)     Height 5\' 4"  (1.626 m)     Head Circumference      Peak Flow      Pain Score 1     Pain Loc      Pain Edu?      Excl. in GC?    No data found.  Updated Vital Signs BP (!) 153/95 (BP Location: Right Arm)   Pulse 100   Temp 98.4 F (36.9 C) (Oral)   Resp 16   Ht 5\' 4"  (1.626 m)   Wt 270 lb (122.5 kg)   LMP 03/19/2017 (Approximate)   SpO2 100%   BMI 46.35 kg/m   Visual Acuity Right Eye Distance:   Left Eye Distance:   Bilateral Distance:    Right Eye Near:   Left Eye Near:    Bilateral Near:     Physical Exam  Constitutional: She is oriented to person, place, and time. She appears well-developed and well-nourished. No distress.  HENT:  Head: Normocephalic and atraumatic.  Mouth/Throat: Oropharynx is clear and moist.  Eyes: Conjunctivae and EOM are normal.  Pupils are equal, round, and reactive to light. No scleral icterus.  Neck: Normal range of motion. Neck supple. No JVD present. No tracheal deviation present. No thyromegaly present.  Cardiovascular: Normal rate, regular rhythm and normal heart sounds. Exam reveals no gallop and no friction rub.  No murmur heard. Pulmonary/Chest: Effort normal and breath sounds normal.  Abdominal: Soft. Bowel sounds are normal. She exhibits no distension. There is no tenderness.  Musculoskeletal: Normal range of motion. She exhibits no edema.  Lymphadenopathy:    She has no cervical adenopathy.  Neurological: She is alert and oriented to person, place, and time. No cranial nerve deficit.  Skin: Skin is warm and dry.  Psychiatric: She has a normal mood and affect. Her behavior is normal. Judgment and thought content normal.  Nursing note and vitals reviewed.    UC Treatments / Results  Labs (all labs ordered are listed, but only abnormal results are displayed) Labs Reviewed - No data to display  EKG  EKG Interpretation None       Radiology No results found.  Procedures Procedures (including critical care time)  Medications Ordered in UC Medications - No data to display   Initial Impression / Assessment and Plan / UC Course  I have reviewed the triage vital signs and the nursing notes.  Pertinent labs & imaging results that were available during my care of the patient were reviewed by me and considered in my medical decision making (see chart for details).     No indication of pneumonia. RTC for fever or SOB, N/V.   Final Clinical Impressions(s) / UC Diagnoses   Final diagnoses:  Bronchitis    ED Discharge Orders    None       Controlled Substance Prescriptions [place] Controlled Substance Registry consulted? Not Applicable   Arnaldo Natal, MD 04/02/17 [phone]

## 2017-04-02 NOTE — ED Triage Notes (Signed)
Patient in today c/o productive cough (green) x 3 weeks. Patient denies fever. Patient has tried Mucinex.

## 2018-07-24 ENCOUNTER — Other Ambulatory Visit: Payer: Self-pay

## 2018-07-24 ENCOUNTER — Ambulatory Visit
Admission: EM | Admit: 2018-07-24 | Discharge: 2018-07-24 | Disposition: A | Discharge status: Home / Self Care | Payer: Managed Care, Other (non HMO) | Attending: Family Medicine | Admitting: Family Medicine

## 2018-07-24 ENCOUNTER — Encounter: Payer: Self-pay | Admitting: Emergency Medicine

## 2018-07-24 DIAGNOSIS — M79671 Pain in right foot: Secondary | ICD-10-CM

## 2018-07-24 MED ORDER — MELOXICAM 15 MG PO TABS: 15.0000 mg | ORAL_TABLET | Freq: Every day | ORAL | 0 refills | Status: DC | PRN

## 2018-07-24 NOTE — ED Triage Notes (Signed)
Patient c/o right foot pain and swelling that started on Sunday. She denies injury. States the swelling has gone down but the pain is still there in the top of her foot.

## 2018-07-24 NOTE — Discharge Instructions (Signed)
Rest, ice, elevation.  Medication as prescribed.  Take care  Dr. Zareen Jamison  

## 2018-07-24 NOTE — ED Provider Notes (Signed)
[facility]    CSN: [id number] Arrival date & time: 07/24/18  1425  History   Chief Complaint Chief Complaint  Patient presents with  . Foot Pain    APPT   HPI  33 year old female presents with right foot pain.  Patient reports that her pain started on Sunday.  She does not recall any injury.  Pain is located primarily in the dorsum of the right foot.  Associated swelling.  Swelling has improved.  However, she continues to have pain.  Pain is currently 4/10 in severity.  Worse with ambulation.  No relieving factors.  No medication tried.  She has iced the area.  No other associated symptoms.  No other complaints.  PMH, Surgical Hx, Family Hx, Social History reviewed and updated as below.  Past Medical History:  Diagnosis Date  . Kidney stones 2015  Morbid obesity  Patient Active Problem List   Diagnosis Date Noted  . Labor and delivery, indication for care 11/30/2015  . Indication for care in labor and delivery, antepartum 11/28/2015  . 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 KIDNEY STONE SURGERY  2016   OB History    Gravida  2   Para  2   Term  2   Preterm      AB      Living  1     SAB      TAB      Ectopic      Multiple  0   Live Births  1        Obstetric Comments  2 vessel cord        Home Medications    Prior to Admission medications   Medication Sig Start Date End Date Taking? Authorizing Provider  loratadine (CLARITIN) 10 MG tablet Take 10 mg by mouth daily.    [provider]  meloxicam (MOBIC) 15 MG tablet Take 1 tablet (15 mg total) by mouth daily as needed for pain. 07/24/18   Tommie Samsook, Annalis Kaczmarczyk G, DO  promethazine (PHENERGAN) 25 MG tablet Take 25 mg by mouth every 6 (six) hours as needed for nausea or vomiting.  07/24/18  [provider]   Family History Family History  Problem Relation Age of Onset  . Hypertension Mother    . Bone cancer Mother        jaw  . Hypertension Father   . Diabetes Father    Social History Social History   Tobacco Use  . Smoking status: Never Smoker  . Smokeless tobacco: Never Used  Substance Use Topics  . Alcohol use: No  . Drug use: No   Allergies   Patient has no known allergies.  Review of Systems Review of Systems  Constitutional: Negative.   Musculoskeletal:       Right foot pain.   Physical Exam Triage Vital Signs ED Triage Vitals [07/24/18 1446]  Enc Vitals Group     BP (!) 164/84     Pulse Rate 84     Resp 18     Temp 98.7 F (37.1 C)     Temp Source Oral     SpO2 100 %     Weight 280 lb (127 kg)     Height 5\' 4"  (1.626 m)     Head Circumference      Peak Flow      Pain Score 4     Pain Loc  Pain Edu?      Excl. in [address]?    Updated Vital Signs BP (!) 164/84 (BP Location: Right Arm)   Pulse 84   Temp 98.7 F (37.1 C) (Oral)   Resp 18   Ht 5\' 4"  (1.626 m)   Wt 127 kg   LMP 07/07/2018   SpO2 100%   BMI 48.06 kg/m   Visual Acuity Right Eye Distance:   Left Eye Distance:   Bilateral Distance:    Right Eye Near:   Left Eye Near:    Bilateral Near:     Physical Exam Vitals signs and nursing note reviewed.  Constitutional:      General: She is not in acute distress.    Appearance: Normal appearance. She is obese.  HENT:     Head: Normocephalic and atraumatic.  Eyes:     General:        Right eye: No discharge.        Left eye: No discharge.     Conjunctiva/sclera: Conjunctivae normal.  Pulmonary:     Effort: Pulmonary effort is normal. No respiratory distress.  Musculoskeletal:     Comments: Right foot with tenderness to palpation on the dorsum of the foot.  Ankle is nontender.  No appreciable swelling.  Skin:    General: Skin is warm.     Findings: No rash.  Neurological:     Mental Status: She is alert.  Psychiatric:        Mood and Affect: Mood normal.        Behavior: Behavior normal.    UC Treatments / Results   Labs (all labs ordered are listed, but only abnormal results are displayed) Labs Reviewed - No data to display  EKG None  Radiology No results found.  Procedures Procedures (including critical care time)  Medications Ordered in UC Medications - No data to display  Initial Impression / Assessment and Plan / UC Course  I have reviewed the triage vital signs and the nursing notes.  Pertinent labs & imaging results that were available during my care of the patient were reviewed by me and considered in my medical decision making (see chart for details).    33 year old female presents with right foot pain.  No injury.  No indications for x-ray.  Treating supportively with rest, ice, elevation.  Meloxicam as needed.  Final Clinical Impressions(s) / UC Diagnoses   Final diagnoses:  Right foot pain     Discharge Instructions     Rest, ice, elevation.  Medication as prescribed.  Take care  Dr. Lacinda Axon    ED Prescriptions    Medication Sig Dispense Auth. Provider   meloxicam (MOBIC) 15 MG tablet Take 1 tablet (15 mg total) by mouth daily as needed for pain. 30 tablet Coral Spikes, DO     Controlled Substance Prescriptions [place] Controlled Substance Registry consulted? Not Applicable   Coral Spikes, DO 07/24/18 [id number]

## 2018-08-21 ENCOUNTER — Other Ambulatory Visit: Payer: Self-pay | Admitting: Family Medicine

## 2018-08-21 DIAGNOSIS — Z20822 Contact with and (suspected) exposure to covid-19: Secondary | ICD-10-CM

## 2018-08-26 LAB — NOVEL CORONAVIRUS, NAA: SARS-CoV-2, NAA: NOT DETECTED

## 2019-01-20 ENCOUNTER — Emergency Department: Payer: Managed Care, Other (non HMO)

## 2019-01-20 ENCOUNTER — Emergency Department
Admission: EM | Admit: 2019-01-20 | Discharge: 2019-01-20 | Disposition: A | Discharge status: Home / Self Care | Payer: Managed Care, Other (non HMO) | Attending: Emergency Medicine | Admitting: Emergency Medicine

## 2019-01-20 ENCOUNTER — Other Ambulatory Visit: Payer: Self-pay

## 2019-01-20 DIAGNOSIS — R7989 Other specified abnormal findings of blood chemistry: Secondary | ICD-10-CM

## 2019-01-20 DIAGNOSIS — R072 Precordial pain: Secondary | ICD-10-CM | POA: Diagnosis not present

## 2019-01-20 DIAGNOSIS — R0789 Other chest pain: Secondary | ICD-10-CM | POA: Diagnosis present

## 2019-01-20 DIAGNOSIS — Z20822 Contact with and (suspected) exposure to covid-19: Secondary | ICD-10-CM

## 2019-01-20 DIAGNOSIS — R945 Abnormal results of liver function studies: Secondary | ICD-10-CM | POA: Diagnosis not present

## 2019-01-20 DIAGNOSIS — Z20828 Contact with and (suspected) exposure to other viral communicable diseases: Secondary | ICD-10-CM | POA: Insufficient documentation

## 2019-01-20 DIAGNOSIS — Z79899 Other long term (current) drug therapy: Secondary | ICD-10-CM | POA: Diagnosis not present

## 2019-01-20 DIAGNOSIS — R1013 Epigastric pain: Secondary | ICD-10-CM | POA: Insufficient documentation

## 2019-01-20 DIAGNOSIS — R079 Chest pain, unspecified: Secondary | ICD-10-CM

## 2019-01-20 LAB — HEPATIC FUNCTION PANEL
ALT: 172 U/L — ABNORMAL HIGH (ref 0–44)
AST: 300 U/L — ABNORMAL HIGH (ref 15–41)
Albumin: 3.5 g/dL (ref 3.5–5.0)
Alkaline Phosphatase: 82 U/L (ref 38–126)
Bilirubin, Direct: 0.2 mg/dL (ref 0.0–0.2)
Indirect Bilirubin: 0.5 mg/dL (ref 0.3–0.9)
Total Bilirubin: 0.7 mg/dL (ref 0.3–1.2)
Total Protein: 6.7 g/dL (ref 6.5–8.1)

## 2019-01-20 LAB — SARS CORONAVIRUS 2 (TAT 6-24 HRS): SARS Coronavirus 2: NEGATIVE

## 2019-01-20 LAB — BASIC METABOLIC PANEL
Anion gap: 9 (ref 5–15)
BUN: 10 mg/dL (ref 6–20)
CO2: 24 mmol/L (ref 22–32)
Calcium: 8.5 mg/dL — ABNORMAL LOW (ref 8.9–10.3)
Chloride: 104 mmol/L (ref 98–111)
Creatinine, Ser: 0.68 mg/dL (ref 0.44–1.00)
GFR calc Af Amer: >60 mL/min (ref >60)
GFR calc non Af Amer: >60 mL/min (ref >60)
Glucose, Bld: 123 mg/dL — ABNORMAL HIGH (ref 70–99)
Potassium: 4 mmol/L (ref 3.5–5.1)
Sodium: 137 mmol/L (ref 135–145)

## 2019-01-20 LAB — CBC
HCT: 41.8 % (ref 36.0–46.0)
Hemoglobin: 13.9 g/dL (ref 12.0–15.0)
MCH: 29.4 pg (ref 26.0–34.0)
MCHC: 33.3 g/dL (ref 30.0–36.0)
MCV: 88.4 fL (ref 80.0–100.0)
Platelets: 260 10*3/uL (ref 150–400)
RBC: 4.73 MIL/uL (ref 3.87–5.11)
RDW: 13 % (ref 11.5–15.5)
WBC: 12.6 10*3/uL — ABNORMAL HIGH (ref 4.0–10.5)
nRBC: 0 % (ref 0.0–0.2)

## 2019-01-20 LAB — TROPONIN I (HIGH SENSITIVITY)
Troponin I (High Sensitivity): <2 ng/L (ref <18)
Troponin I (High Sensitivity): 5 ng/L (ref <18)

## 2019-01-20 LAB — LIPASE, BLOOD: Lipase: 26 U/L (ref 11–51)

## 2019-01-20 MED ORDER — SODIUM CHLORIDE 0.9% FLUSH: 3.0000 mL | Freq: Once | INTRAVENOUS | Status: DC

## 2019-01-20 MED ORDER — ONDANSETRON 4 MG PO TBDP
4.0000 mg | ORAL_TABLET | Freq: Once | ORAL | Status: AC
  Administered 2019-01-20: 18:00:00 4 mg via ORAL
  Filled 2019-01-20: qty 1

## 2019-01-20 MED ORDER — ONDANSETRON HCL 4 MG PO TABS: 4.0000 mg | ORAL_TABLET | Freq: Every day | ORAL | 0 refills | Status: AC | PRN

## 2019-01-20 NOTE — ED Triage Notes (Signed)
Pt c/o sudden onset substernal chest pain that started today while having a BM with some diaphoresis., pt is in NAD, skin is warm and dry. Pt is a/ox4

## 2019-01-20 NOTE — ED Notes (Signed)
US at bedside

## 2019-01-20 NOTE — Discharge Instructions (Signed)
Your work-up was reassuring no signs of heart attack.  Your liver function was slightly elevated.  If your coronavirus test is negative you should have these rechecked in 2 days to ensure that they are stable or downtrending.  Your ultrasound of your gallbladder did show gallstones but no evidence of infection of your gallbladder.  If you develop continued right upper quadrant pain after eating you may need to have her gallbladder removed.  If you have fevers or pain on her right side that does not improve you need to come back to the ER.  I given you a surgeon's number for follow-up with outpatient for elective surgery.  Return to the ER for worsening pain, fevers or any other concerns

## 2019-01-20 NOTE — ED Provider Notes (Signed)
[facility] Emergency Department Provider Note  ____________________________________________   First MD Initiated Contact with Patient 01/20/19 1434     (approximate)  I have reviewed the triage vital signs and the nursing notes.   HISTORY  Chief Complaint Chest Pain    HPI Melissa Nunez is a 33 y.o. female otherwise healthy who comes in with chest pain.  Patient was having a bowel movement and then developed diaphoresis and some chest discomfort.  Patient states that the bowel movement was soft in nature and she was not straining.  She states the chest pain started epigastrically and then went into her entire chest.  The pain was constant, severe, lasted few minutes and gradually got better on its own, was associate with a little bit of shortness of breath.  Denies ever having this previously.  She states she was worried because she has a family history of someone having a heart attack at a young age.  Otherwise she had not seen her primary care doctor recently but denies any known risk factors for heart disease.  No prior heart disease herself.  She is not a smoker.  Denies any risk factors for pulmonary embolism.  Right now her pain is a 1 out of 10.  Prior to this patient does state that she has not been feeling well for the past 2 or 3 days little bit of cough and congestion.  Patient has been at work with some known positive coronavirus contacts.            Past Medical History:  Diagnosis Date  . Kidney stones 2015    Patient Active Problem List   Diagnosis Date Noted  . Labor and delivery, indication for care 11/30/2015  . Indication for care in labor and delivery, antepartum 11/28/2015  . 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 KIDNEY STONE SURGERY  2016    Prior to Admission medications   Medication Sig Start Date End Date Taking? Authorizing Provider   loratadine (CLARITIN) 10 MG tablet Take 10 mg by mouth daily.    [provider]  meloxicam (MOBIC) 15 MG tablet Take 1 tablet (15 mg total) by mouth daily as needed for pain. 07/24/18   Tommie Sams, DO  promethazine (PHENERGAN) 25 MG tablet Take 25 mg by mouth every 6 (six) hours as needed for nausea or vomiting.  07/24/18  [provider]    Allergies Patient has no known allergies.  Family History  Problem Relation Age of Onset  . Hypertension Mother   . Bone cancer Mother        jaw  . Hypertension Father   . Diabetes Father     Social History Social History   Tobacco Use  . Smoking status: Never Smoker  . Smokeless tobacco: Never Used  Substance Use Topics  . Alcohol use: No  . Drug use: No      Review of Systems Constitutional: No fever/chills Eyes: No visual changes. ENT: No sore throat. Cardiovascular: Positive chest pain Respiratory: Denies shortness of breath. Gastrointestinal: No abdominal pain.  No nausea, no vomiting.  No diarrhea.  No constipation. Genitourinary: Negative for dysuria. Musculoskeletal: Negative for back pain. Skin: Negative for rash. Neurological: Negative for headaches, focal weakness or numbness. All other ROS negative ____________________________________________   PHYSICAL EXAM:  VITAL SIGNS: ED Triage Vitals  Enc Vitals Group     BP 01/20/19 1414 Marland Kitchen)  143/70     Pulse Rate 01/20/19 1414 77     Resp 01/20/19 1414 17     Temp 01/20/19 1414 98.3 F (36.8 C)     Temp Source 01/20/19 1414 Oral     SpO2 01/20/19 1414 100 %     Weight 01/20/19 1415 270 lb (122.5 kg)     Height 01/20/19 1415 5\' 7"  (1.702 m)     Head Circumference --      Peak Flow --      Pain Score 01/20/19 1415 2     Pain Loc --      Pain Edu? --      Excl. in [place]? --     Constitutional: Alert and oriented. Well appearing and in no acute distress. Eyes: Conjunctivae are normal. EOMI. Head: Atraumatic. Nose: No congestion/rhinnorhea.  Mouth/Throat: Mucous membranes are moist.   Neck: No stridor. Trachea Midline. FROM Cardiovascular: Normal rate, regular rhythm. Grossly normal heart sounds.  Good peripheral circulation. Respiratory: Normal respiratory effort.  No retractions. Lungs CTAB. Gastrointestinal: Soft and nontender. No distention. No abdominal bruits.  Musculoskeletal: No lower extremity tenderness nor edema.  No joint effusions. Neurologic:  Normal speech and language. No gross focal neurologic deficits are appreciated.  Skin:  Skin is warm, dry and intact. No rash noted. Psychiatric: Mood and affect are normal. Speech and behavior are normal. GU: Deferred   ____________________________________________   LABS (all labs ordered are listed, but only abnormal results are displayed)  Labs Reviewed  BASIC METABOLIC PANEL - Abnormal; Notable for the following components:      Result Value   Glucose, Bld 123 (*)    Calcium 8.5 (*)    All other components within normal limits  CBC - Abnormal; Notable for the following components:   WBC 12.6 (*)    All other components within normal limits  HEPATIC FUNCTION PANEL - Abnormal; Notable for the following components:   AST 300 (*)    ALT 172 (*)    All other components within normal limits  SARS CORONAVIRUS 2 (TAT 6-24 HRS)  LIPASE, BLOOD  POC URINE PREG, ED  TROPONIN I (HIGH SENSITIVITY)  TROPONIN I (HIGH SENSITIVITY)   ____________________________________________   ED ECG REPORT I, 14/08/20, the attending physician, personally viewed and interpreted this ECG.  EKG is normal sinus rate of 78, maybe some minimal ST elevation in 2 3 aVF looks more like early repolarization.,  And there are no reciprocal changes.  No T wave inversions, normal intervals. ____________________________________________  RADIOLOGY Concha Se, personally viewed and evaluated these images (plain radiographs) as part of my medical decision making, as well as reviewing the  written report by the radiologist.  ED MD interpretation: No evidence of pneumonia  Official radiology report(s): Dg Chest 2 View  Result Date: 01/20/2019 CLINICAL DATA:  Pt c/o sudden onset substernal chest pain that started today while having a BM with some diaphoresischest pain EXAM: CHEST - 2 VIEW COMPARISON:  01/27/2012 FINDINGS: The heart size and mediastinal contours are within normal limits. Both lungs are clear. The visualized skeletal structures are unremarkable. IMPRESSION: No active cardiopulmonary disease. Electronically Signed   By: 01/29/2012 M.D.   On: 01/20/2019 14:43    ____________________________________________   PROCEDURES  Procedure(s) performed (including Critical Care):  Procedures   ____________________________________________   INITIAL IMPRESSION / ASSESSMENT AND PLAN / ED COURSE   Valen Mascaro was evaluated in Emergency Department on 01/20/2019 for the symptoms described  in the history of present illness. She was evaluated in the context of the global COVID-19 pandemic, which necessitated consideration that the patient might be at risk for infection with the SARS-CoV-2 virus that causes COVID-19. Institutional protocols and algorithms that pertain to the evaluation of patients at risk for COVID-19 are in a state of rapid change based on information released by regulatory bodies including the CDC and federal and state organizations. These policies and algorithms were followed during the patient's care in the ED.    Most Likely DDx:  -MSK (atypical chest pain) but will get cardiac markers to evaluate for ACS given risk factors/age  DDx that was also considered d/t potential to cause harm, but was found less likely based on history and physical (as detailed above): -PNA (no fevers, cough but CXR to evaluate) -PNX (reassured with equal b/l breath sounds, CXR to evaluate) -Symptomatic anemia (will get H&H) -Pulmonary embolism as no sob at rest, not  pleuritic in nature, no hypoxia -Aortic Dissection as no tearing pain and no radiation to the mid back, pulses equal -Pericarditis no rub on exam, EKG changes or hx to suggest dx -Tamponade (no notable SOB, tachycardic, hypotensive) -Esophageal rupture (no h/o diffuse vomitting/no crepitus)  White count slightly elevated 12.6.  Troponins are negative x2 ruling her out for ACS.  Reevaluated patient and she has had a little bit of nausea at this time.  We will give some Zofran.  Given patient does have positive coronavirus contacts and she has been feeling unwell for a few days, cough, congestion coronavirus swab was sent.  Patient understands she is to quarantine until it resolves.  Patient requesting to go home but I stated that we need to follow-up on the liver enzymes.  Patient now states that her pain is more epigastric in nature.  Labs were never sent for hepatic function or lipase.  I added these on.  Patient is hepatic function is still evaluate elevated with her AST 2-1.  Patient denies daily alcohol drinking.  Given patient is epigastric tenderness will get ultrasound to make sure is no evidence of cholecystitis.  Proceed with evidence of gallstones but no evidence of cholecystitis.   Reevaluated patient her pain is better.  Patient feels comfortable with going home.  I explained to patient that she may need to have her gallbladder removed if she continues develop pain in that area.  Again on exam there is no signs of cholecystitis.  Patient will follow up with her coronavirus test and if negative she will follow with her PCP for liver function recheck in 2 days.  I discussed the provisional nature of ED diagnosis, the treatment so far, the ongoing plan of care, follow up appointments and return precautions with the patient and any family or support people present. They expressed understanding and agreed with the plan, discharged home.  ____________________________________________    FINAL CLINICAL IMPRESSION(S) / ED DIAGNOSES   Final diagnoses:  Chest pain, unspecified type  Epigastric pain  Suspected COVID-19 virus infection  Elevated liver function tests     MEDICATIONS GIVEN DURING THIS VISIT:  Medications  sodium chloride flush (NS) 0.9 % injection 3 mL (3 mLs Intravenous Not Given 01/20/19 1439)  ondansetron (ZOFRAN-ODT) disintegrating tablet 4 mg (4 mg Oral Given 01/20/19 1806)     ED Discharge Orders         Ordered    ondansetron (ZOFRAN) 4 MG tablet  Daily PRN     01/20/19 1928  Note:  This document was prepared using Dragon voice recognition software and may include unintentional dictation errors.   Vanessa Lockhart, MD 01/20/19 [phone]

## 2019-01-20 NOTE — ED Notes (Signed)
ED Provider at bedside. 

## 2019-01-20 NOTE — ED Notes (Signed)
See triage note  Presents with some chest pain  States she was straining to have BM when developed pain  States she was diaphoretic  Now skin w/d

## 2019-08-14 HISTORY — PX: CHOLECYSTECTOMY: SHX55

## 2019-10-16 ENCOUNTER — Encounter: Payer: Self-pay | Admitting: Emergency Medicine

## 2019-10-16 ENCOUNTER — Ambulatory Visit
Admission: EM | Admit: 2019-10-16 | Discharge: 2019-10-16 | Disposition: A | Discharge status: Home / Self Care | Payer: Managed Care, Other (non HMO) | Attending: Physician Assistant | Admitting: Physician Assistant

## 2019-10-16 ENCOUNTER — Ambulatory Visit (INDEPENDENT_AMBULATORY_CARE_PROVIDER_SITE_OTHER): Payer: Managed Care, Other (non HMO)

## 2019-10-16 ENCOUNTER — Other Ambulatory Visit: Payer: Self-pay

## 2019-10-16 DIAGNOSIS — R0789 Other chest pain: Secondary | ICD-10-CM | POA: Diagnosis not present

## 2019-10-16 DIAGNOSIS — R05 Cough: Secondary | ICD-10-CM | POA: Diagnosis not present

## 2019-10-16 DIAGNOSIS — Z7952 Long term (current) use of systemic steroids: Secondary | ICD-10-CM | POA: Insufficient documentation

## 2019-10-16 DIAGNOSIS — R059 Cough, unspecified: Secondary | ICD-10-CM

## 2019-10-16 DIAGNOSIS — R079 Chest pain, unspecified: Secondary | ICD-10-CM | POA: Diagnosis not present

## 2019-10-16 DIAGNOSIS — Z9049 Acquired absence of other specified parts of digestive tract: Secondary | ICD-10-CM | POA: Insufficient documentation

## 2019-10-16 DIAGNOSIS — Z791 Long term (current) use of non-steroidal anti-inflammatories (NSAID): Secondary | ICD-10-CM | POA: Diagnosis not present

## 2019-10-16 DIAGNOSIS — Z79899 Other long term (current) drug therapy: Secondary | ICD-10-CM | POA: Diagnosis not present

## 2019-10-16 DIAGNOSIS — Z87442 Personal history of urinary calculi: Secondary | ICD-10-CM | POA: Insufficient documentation

## 2019-10-16 DIAGNOSIS — Z20822 Contact with and (suspected) exposure to covid-19: Secondary | ICD-10-CM | POA: Insufficient documentation

## 2019-10-16 MED ORDER — PREDNISONE 20 MG PO TABS: 40.0000 mg | ORAL_TABLET | Freq: Every day | ORAL | 0 refills | Status: AC

## 2019-10-16 NOTE — Discharge Instructions (Signed)
Your chest xray is normal today.  Push fluids to ensure adequate hydration and keep secretions thin.  Tylenol and/or ibuprofen as needed for pain or fevers.  Prednisone dose pack to help with cough and chest symptoms.  Self isolate until covid results are back and negative.  Will notify you by phone of any positive findings. Your negative results will be sent through your MyChart.     If symptoms worsen or do not improve in the next week to return to be seen or to follow up with your PCP.

## 2019-10-16 NOTE — ED Provider Notes (Signed)
[facility]    CSN: [id number] Arrival date & time: 10/16/19  1539      History   Chief Complaint Chief Complaint  Patient presents with  . Cough  . Shortness of Breath  . Fever    HPI Melissa Nunez is a 34 y.o. female.   Melissa Nunez presents with complaints of cough and shortness of breath . Received her second covid vaccine on Tuesday 8/31. Fever aches/ pains following. Today developed chest pain . No current or active chest pain. Cough, occasionally productive, mostly dry. Activity worsens pain as does cough. Some shortness of breath . No further fever. Some nasal congestion. Sore throat. No ear pain. Recent gallbladder removal, no change to gi symptoms. Took tylenol initially for fever, no further medications for symptoms. Some coworkers have been ill with covid, unknown if close exposure. Works in [employer] ge[employer]. No asthma history. Doesn't smoke.   ROS per HPI, negative if not otherwise mentioned.      Past Medical History:  Diagnosis Date  . Kidney stones 2015    Patient Active Problem List   Diagnosis Date Noted  . Labor and delivery, indication for care 11/30/2015  . Indication for care in labor and delivery, antepartum 11/28/2015  . 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 CHOLECYSTECTOMY    . KIDNEY STONE SURGERY  2016    OB History    Gravida  2   Para  2   Term  2   Preterm      AB      Living  1     SAB      TAB      Ectopic      Multiple  0   Live Births  1        Obstetric Comments  2 vessel cord          Home Medications    Prior to Admission medications   Medication Sig Start Date End Date Taking? Authorizing Provider  loratadine (CLARITIN) 10 MG tablet Take 10 mg by mouth daily.    [provider]  meloxicam (MOBIC) 15 MG tablet Take 1 tablet (15 mg total) by mouth daily as needed for pain. 07/24/18   Tommie Sams,  DO  ondansetron (ZOFRAN) 4 MG tablet Take 1 tablet (4 mg total) by mouth daily as needed for nausea or vomiting. 01/20/19 01/20/20  Concha Se, MD  predniSONE (DELTASONE) 20 MG tablet Take 2 tablets (40 mg total) by mouth daily with breakfast for 5 days. 10/16/19 10/21/19  Georgetta Haber, NP  promethazine (PHENERGAN) 25 MG tablet Take 25 mg by mouth every 6 (six) hours as needed for nausea or vomiting.  07/24/18  [provider]    Family History Family History  Problem Relation Age of Onset  . Hypertension Mother   . Bone cancer Mother        jaw  . Hypertension Father   . Diabetes Father     Social History Social History   Tobacco Use  . Smoking status: Never Smoker  . Smokeless tobacco: Never Used  Vaping Use  . Vaping Use: Never used  Substance Use Topics  . Alcohol use: No  . Drug use: No     Allergies   Patient has no known allergies.   Review of Systems Review of Systems   Physical Exam Triage Vital Signs ED Triage  Vitals  Enc Vitals Group     BP 10/16/19 1549 (!) 143/94     Pulse Rate 10/16/19 1549 86     Resp 10/16/19 1549 16     Temp 10/16/19 1549 98.6 F (37 C)     Temp Source 10/16/19 1549 Oral     SpO2 10/16/19 1549 100 %     Weight 10/16/19 1546 280 lb (127 kg)     Height 10/16/19 1546 5\' 4"  (1.626 m)     Head Circumference --      Peak Flow --      Pain Score 10/16/19 1545 5     Pain Loc --      Pain Edu? --      Excl. in [place]? --    No data found.  Updated Vital Signs BP (!) 143/94 (BP Location: Right Arm)   Pulse 86   Temp 98.6 F (37 C) (Oral)   Resp 16   Ht 5\' 4"  (1.626 m)   Wt 280 lb (127 kg)   LMP 10/10/2019 (Approximate)   SpO2 100%   Breastfeeding No   BMI 48.06 kg/m   Visual Acuity Right Eye Distance:   Left Eye Distance:   Bilateral Distance:    Right Eye Near:   Left Eye Near:    Bilateral Near:     Physical Exam Constitutional:      General: She is not in acute distress.    Appearance: She is  well-developed.  Cardiovascular:     Rate and Rhythm: Normal rate.  Pulmonary:     Effort: Pulmonary effort is normal.  Skin:    General: Skin is warm and dry.  Neurological:     Mental Status: She is alert and oriented to person, place, and time.      UC Treatments / Results  Labs (all labs ordered are listed, but only abnormal results are displayed) Labs Reviewed  SARS CORONAVIRUS 2 (TAT 6-24 HRS)    EKG   Radiology DG Chest 2 View  Result Date: 10/16/2019 CLINICAL DATA:  Chest pain cough EXAM: CHEST - 2 VIEW COMPARISON:  01/20/2019 FINDINGS: The heart size and mediastinal contours are within normal limits. Both lungs are clear. The visualized skeletal structures are unremarkable. IMPRESSION: No active cardiopulmonary disease. Electronically Signed   By: 12/16/2019 M.D.   On: 10/16/2019 16:25    Procedures Procedures (including critical care time)  Medications Ordered in UC Medications - No data to display  Initial Impression / Assessment and Plan / UC Course  I have reviewed the triage vital signs and the nursing notes.  Pertinent labs & imaging results that were available during my care of the patient were reviewed by me and considered in my medical decision making (see chart for details).     cxr clear. No work of breathing. Vitals stable. Afebrile now. No acs risk factors and history more consistent with respiratory source of symptoms. Prednisone provided. Supportive cares recommended. Covid testing pending and isolation instructions provided.  Return precautions provided. Patient verbalized understanding and agreeable to plan. Ambulatory out of clinic without difficulty.     Final Clinical Impressions(s) / UC Diagnoses   Final diagnoses:  Cough  Chest wall pain     Discharge Instructions     Your chest xray is normal today.  Push fluids to ensure adequate hydration and keep secretions thin.  Tylenol and/or ibuprofen as needed for pain or fevers.    Prednisone dose pack to help with  cough and chest symptoms.  Self isolate until covid results are back and negative.  Will notify you by phone of any positive findings. Your negative results will be sent through your MyChart.     If symptoms worsen or do not improve in the next week to return to be seen or to follow up with your PCP.      ED Prescriptions    Medication Sig Dispense Auth. Provider   predniSONE (DELTASONE) 20 MG tablet Take 2 tablets (40 mg total) by mouth daily with breakfast for 5 days. 10 tablet Georgetta Haber, NP     PDMP not reviewed this encounter.   Georgetta Haber, NP 10/16/19 [phone]

## 2019-10-16 NOTE — ED Triage Notes (Signed)
Patient c/o chest tightness, cough, SOB and fever that started on Wed.   Patient states that she had her 2nd Moderna COVID vaccine on Tuesday.

## 2019-10-17 LAB — SARS CORONAVIRUS 2 (TAT 6-24 HRS): SARS Coronavirus 2: NEGATIVE

## 2020-02-03 ENCOUNTER — Encounter: Payer: Self-pay | Admitting: Emergency Medicine

## 2020-02-03 ENCOUNTER — Other Ambulatory Visit: Payer: Self-pay

## 2020-02-03 ENCOUNTER — Ambulatory Visit
Admission: EM | Admit: 2020-02-03 | Discharge: 2020-02-03 | Disposition: A | Discharge status: Home / Self Care | Payer: Managed Care, Other (non HMO) | Attending: Sports Medicine | Admitting: Sports Medicine

## 2020-02-03 DIAGNOSIS — H66001 Acute suppurative otitis media without spontaneous rupture of ear drum, right ear: Secondary | ICD-10-CM | POA: Diagnosis not present

## 2020-02-03 DIAGNOSIS — J069 Acute upper respiratory infection, unspecified: Secondary | ICD-10-CM | POA: Diagnosis not present

## 2020-02-03 DIAGNOSIS — Z20822 Contact with and (suspected) exposure to covid-19: Secondary | ICD-10-CM | POA: Diagnosis not present

## 2020-02-03 DIAGNOSIS — R059 Cough, unspecified: Secondary | ICD-10-CM | POA: Diagnosis present

## 2020-02-03 DIAGNOSIS — J029 Acute pharyngitis, unspecified: Secondary | ICD-10-CM | POA: Insufficient documentation

## 2020-02-03 LAB — GROUP A STREP BY PCR: Group A Strep by PCR: NOT DETECTED

## 2020-02-03 LAB — RESP PANEL BY RT-PCR (FLU A&B, COVID) ARPGX2
Influenza A by PCR: NEGATIVE
Influenza B by PCR: NEGATIVE
SARS Coronavirus 2 by RT PCR: NEGATIVE

## 2020-02-03 MED ORDER — AMOXICILLIN-POT CLAVULANATE 875-125 MG PO TABS: 1.0000 | ORAL_TABLET | Freq: Two times a day (BID) | ORAL | 0 refills | Status: AC

## 2020-02-03 MED ORDER — PROMETHAZINE-DM 6.25-15 MG/5ML PO SYRP: 5.0000 mL | ORAL_SOLUTION | Freq: Four times a day (QID) | ORAL | 0 refills | Status: DC | PRN

## 2020-02-03 MED ORDER — BENZONATATE 100 MG PO CAPS: 200.0000 mg | ORAL_CAPSULE | Freq: Three times a day (TID) | ORAL | 0 refills | Status: DC

## 2020-02-03 NOTE — ED Triage Notes (Signed)
Patient c/o cough, nasal congestion and sore throat that started Monday. Denies fever.

## 2020-02-03 NOTE — ED Provider Notes (Signed)
[facility]    CSN: [id number] Arrival date & time: 02/03/20  1816      History   Chief Complaint Chief Complaint  Patient presents with  . Sore Throat  . Cough  . Nasal Congestion    HPI Melissa Nunez is a 34 y.o. female.   HPI   34 year old female here for evaluation of cough, nasal congestion, and sore throat that has had for the past 3 days.  Patient also has had ear pain and pressure, headache, and scratchy throat.  Patient denies fever, shortness of breath or wheezing, or GI complaints.  Patient's coworker has had similar symptoms and was diagnosed with strep and a bilateral ear infection.  Past Medical History:  Diagnosis Date  . Kidney stones 2015    Patient Active Problem List   Diagnosis Date Noted  . Labor and delivery, indication for care 11/30/2015  . Indication for care in labor and delivery, antepartum 11/28/2015  . 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 CHOLECYSTECTOMY    . KIDNEY STONE SURGERY  2016    OB History    Gravida  2   Para  2   Term  2   Preterm      AB      Living  1     SAB      IAB      Ectopic      Multiple  0   Live Births  1        Obstetric Comments  2 vessel cord          Home Medications    Prior to Admission medications   Medication Sig Start Date End Date Taking? Authorizing Provider  amoxicillin-clavulanate (AUGMENTIN) 875-125 MG tablet Take 1 tablet by mouth every 12 (twelve) hours for 10 days. 02/03/20 02/13/20  Becky Augusta, NP  benzonatate (TESSALON) 100 MG capsule Take 2 capsules (200 mg total) by mouth every 8 (eight) hours. 02/03/20   Becky Augusta, NP  promethazine-dextromethorphan (PROMETHAZINE-DM) 6.25-15 MG/5ML syrup Take 5 mLs by mouth 4 (four) times daily as needed. 02/03/20   Becky Augusta, NP  loratadine (CLARITIN) 10 MG tablet Take 10 mg by mouth daily.  02/03/20  [provider]  promethazine (PHENERGAN) 25 MG tablet Take 25 mg by mouth every 6 (six) hours as needed for nausea or vomiting.  07/24/18  [provider]    Family History Family History  Problem Relation Age of Onset  . Hypertension Mother   . Bone cancer Mother        jaw  . Hypertension Father   . Diabetes Father     Social History Social History   Tobacco Use  . Smoking status: Never Smoker  . Smokeless tobacco: Never Used  Vaping Use  . Vaping Use: Never used  Substance Use Topics  . Alcohol use: No  . Drug use: No     Allergies   Patient has no known allergies.   Review of Systems Review of Systems  Constitutional: Negative for activity change, appetite change and fever.  HENT: Positive for congestion, ear pain, rhinorrhea and sore throat.   Respiratory: Positive for cough. Negative for shortness of breath and wheezing.   Cardiovascular: Negative for chest pain.  Gastrointestinal: Negative for diarrhea, nausea and vomiting.  Musculoskeletal: Negative for arthralgias and myalgias.  Skin: Negative for rash.  Neurological: Positive for headaches.  Hematological:  Negative.   Psychiatric/Behavioral: Negative.      Physical Exam Triage Vital Signs ED Triage Vitals  Enc Vitals Group     BP 02/03/20 1902 (!) 155/91     Pulse Rate 02/03/20 1859 60     Resp 02/03/20 1859 18     Temp 02/03/20 1859 98.6 F (37 C)     Temp Source 02/03/20 1859 Oral     SpO2 02/03/20 1859 100 %     Weight 02/03/20 1857 280 lb (127 kg)     Height 02/03/20 1857 5\' 4"  (1.626 m)     Head Circumference --      Peak Flow --      Pain Score 02/03/20 1856 3     Pain Loc --      Pain Edu? --      Excl. in GC? --    No data found.  Updated Vital Signs BP (!) 155/91   Pulse 60   Temp 98.6 F (37 C) (Oral)   Resp 18   Ht 5\' 4"  (1.626 m)   Wt 280 lb (127 kg)   LMP 02/01/2020   SpO2 100%   BMI 48.06 kg/m   Visual Acuity Right Eye Distance:   Left Eye Distance:    Bilateral Distance:    Right Eye Near:   Left Eye Near:    Bilateral Near:     Physical Exam Vitals and nursing note reviewed.  Constitutional:      General: She is not in acute distress.    Appearance: She is well-developed and normal weight. She is not toxic-appearing.  HENT:     Head: Normocephalic and atraumatic.     Right Ear: Ear canal normal. Tympanic membrane is erythematous.     Left Ear: Tympanic membrane and ear canal normal. Tympanic membrane is not erythematous.     Ears:     Comments: Right TM is erythematous and injected.  Left TM is normal.    Nose: Congestion and rhinorrhea present.     Comments: Nasal mucosa is mildly erythematous and edematous with clear nasal discharge.    Mouth/Throat:     Mouth: Mucous membranes are moist.     Pharynx: Oropharynx is clear. No oropharyngeal exudate.     Tonsils: 0 on the right. 0 on the left.  Cardiovascular:     Rate and Rhythm: Normal rate and regular rhythm.     Heart sounds: Normal heart sounds. No murmur heard. No gallop.   Pulmonary:     Effort: Pulmonary effort is normal.     Breath sounds: Normal breath sounds. No wheezing, rhonchi or rales.  Musculoskeletal:     Cervical back: Normal range of motion and neck supple.  Lymphadenopathy:     Cervical: No cervical adenopathy.  Skin:    General: Skin is warm and dry.     Capillary Refill: Capillary refill takes less than 2 seconds.     Findings: No erythema.  Neurological:     General: No focal deficit present.     Mental Status: She is alert and oriented to person, place, and time.  Psychiatric:        Mood and Affect: Mood normal.        Behavior: Behavior normal.      UC Treatments / Results  Labs (all labs ordered are listed, but only abnormal results are displayed) Labs Reviewed  GROUP A STREP BY PCR  RESP PANEL BY RT-PCR (FLU A&B, COVID) [id number]  EKG   Radiology No results found.  Procedures Procedures (including critical care  time)  Medications Ordered in UC Medications - No data to display  Initial Impression / Assessment and Plan / UC Course  I have reviewed the triage vital signs and the nursing notes.  Pertinent labs & imaging results that were available during my care of the patient were reviewed by me and considered in my medical decision making (see chart for details).   For evaluation of cold symptoms that she had for the past 3 days.  Patient had a respiratory panel and strep PCR collected at triage and both are negative.  Physical exam reveals an injected right tympanic membrane that is also erythematous consistent with otitis media.  Nasal mucosa is erythematous and edematous with clear nasal discharge.  Lungs are clear to auscultation.  Will discharge patient home on Augmentin 875 twice daily x10 days, Tessalon Perles, and Promethazine DM.  Final Clinical Impressions(s) / UC Diagnoses   Final diagnoses:  Upper respiratory tract infection, unspecified type  Non-recurrent acute suppurative otitis media of right ear without spontaneous rupture of tympanic membrane     Discharge Instructions     Take the Augmentin twice daily for 10 days for your ear infection.  Take it with food.  Use the Tessalon Perles every 8 hours during the day as needed for cough.  Take them with a small sip of water.  This may give you some numbness to the base of your tongue or metallic taste in her mouth, this is normal.  Use the Promethazine DM cough syrup as needed for cough and congestion at bedtime.  This medication will make you drowsy.  Use Tylenol and ibuprofen as needed for fever and pain.  Follow-up with your primary care provider for any new or worsening symptoms.    ED Prescriptions    Medication Sig Dispense Auth. Provider   amoxicillin-clavulanate (AUGMENTIN) 875-125 MG tablet Take 1 tablet by mouth every 12 (twelve) hours for 10 days. 20 tablet Becky Augusta, NP   benzonatate (TESSALON) 100 MG capsule  Take 2 capsules (200 mg total) by mouth every 8 (eight) hours. 21 capsule Becky Augusta, NP   promethazine-dextromethorphan (PROMETHAZINE-DM) 6.25-15 MG/5ML syrup Take 5 mLs by mouth 4 (four) times daily as needed. 118 mL Becky Augusta, NP     PDMP not reviewed this encounter.   Becky Augusta, NP 02/03/20 2007

## 2020-02-03 NOTE — Discharge Instructions (Addendum)
Take the Augmentin twice daily for 10 days for your ear infection.  Take it with food.  Use the Tessalon Perles every 8 hours during the day as needed for cough.  Take them with a small sip of water.  This may give you some numbness to the base of your tongue or metallic taste in her mouth, this is normal.  Use the Promethazine DM cough syrup as needed for cough and congestion at bedtime.  This medication will make you drowsy.  Use Tylenol and ibuprofen as needed for fever and pain.  Follow-up with your primary care provider for any new or worsening symptoms.

## 2020-05-31 ENCOUNTER — Ambulatory Visit (INDEPENDENT_AMBULATORY_CARE_PROVIDER_SITE_OTHER): Payer: 59 | Admitting: Family Medicine

## 2020-05-31 ENCOUNTER — Other Ambulatory Visit: Payer: Self-pay

## 2020-05-31 ENCOUNTER — Encounter: Payer: Self-pay | Admitting: Family Medicine

## 2020-05-31 VITALS — BP 145/81 | HR 85 | Ht 64.0 in | Wt 288.6 lb

## 2020-05-31 DIAGNOSIS — Z7689 Persons encountering health services in other specified circumstances: Secondary | ICD-10-CM

## 2020-05-31 DIAGNOSIS — F331 Major depressive disorder, recurrent, moderate: Secondary | ICD-10-CM | POA: Diagnosis not present

## 2020-05-31 DIAGNOSIS — F419 Anxiety disorder, unspecified: Secondary | ICD-10-CM | POA: Diagnosis not present

## 2020-05-31 DIAGNOSIS — F3341 Major depressive disorder, recurrent, in partial remission: Secondary | ICD-10-CM | POA: Insufficient documentation

## 2020-05-31 DIAGNOSIS — Z6841 Body Mass Index (BMI) 40.0 and over, adult: Secondary | ICD-10-CM

## 2020-05-31 MED ORDER — SERTRALINE HCL 50 MG PO TABS: 50.0000 mg | ORAL_TABLET | Freq: Every day | ORAL | 1 refills | Status: DC

## 2020-05-31 NOTE — Patient Instructions (Addendum)
Thank you for coming to the office today.  Start Sertraline 50mg  daily can take in morning with meal, may take 2-4 weeks for full effect. May need to adjust dose in future.  PSYCHIATRY (AND THERAPY-COUSENLING)  [facility] Address: [address], [place], [place] Phone: [phone] Can do Virtual Telehealth  [facility] Address: [address] [place], [place] [email or web address] Phone: [phone]  [facility]) Address: [address], [place], [place] Hours: 8:30AM-5PM Phone: [phone]  [facility] [address], [place], [place] Phone: [phone] (Option 1) [email or web address]  [facility]) [place] [address], [place], [place] Phone: [phone]  ----------------------------------------------------------------- THERAPIST ONLY   [facility] [address] [place], [place] [address] P: [phone]  [phone], LCSW [address] [place], [place] Main Line: [phone]  [facility]   Address: [address] [place], [place], [place] Hours: Open today  9AM-7PM Phone: [phone]  [facility] [address] [facility]  [facility] Address: [address] [place], [place] Phone: [phone]     Contact [phone] OBGYN for follow-up Pap Smear. Last was 01/2015, showed some Atypical ASCUS  Pap IG, HPV-hr - [facility] Specimen:  Genital - Cervix uteri structure (body structure)  Ref Range & Units 5 yr ago Comments  DIAGNOSIS: - [facility]  CommentAbnormal  EPITHELIAL CELL ABNORMALITY.  ATYPICAL SQUAMOUS CELLS OF UNDETERMINED SIGNIFICANCE.  Recommendation: - [facility]  CommentAbnormal  Suggest follow up as clinically appropriate.  Specimen adequacy: - [facility]  Comment   Satisfactory for evaluation. Endocervical and/or squamous metaplastic  cells (endocervical component) are present.  Clinician provided ICD10: - [facility]  Comment  Z11.51  Z12.4  PERFORMED BY: - [facility]  Comment  02/2015, Cytotechnologist (ASCP)  QC reviewed by: - [facility]  Comment  Nadyne Coombes, Cytotechnologist (ASCP)  Electronically signed by: - [facility]  Comment  Myrla Halsted, MD, Pathologist  . - [facility]  .    Pathologist provided ICD10: - [facility]  Comment  R87.610  Note: - [facility]  Comment  The Pap smear is a screening test designed to aid in the detection of  premalignant and malignant conditions of the uterine cervix. It is not a  diagnostic procedure and should not be used as the sole means of detecting  cervical cancer. Both false-positive and false-negative reports do occur.  Test Methodology: - [facility]  Comment  This liquid based ThinPrep(R) pap test was screened with the  use of an image guided system.  HPV, high-risk - [facility] Negative Negative  This high-risk HPV test detects thirteen high-risk types  (16/18/31/33/35/39/45/51/52/56/58/59/68) without differentiation.  Resulting Agency  04-06-1985    Narrative Performed by Raechel Chute Performed at: [address]  [address], [place], [place]  Lab Director: [id number] MD, Phone: [phone]  Performed at: [address] [place]  [address] [place], [place]  Lab Director: [id number] MD, Phone: [phone]  Performed at: [address]  [address] [place], [place]  Lab Director: [id number] MD, Phone: [phone]  Specimen Comment: Source............[id number]  Specimen Comment: No. of containers.Marland Kitchen01 CYTYC Thin Prep Vial Specimen Collected: 02/08/15 10:18 AM Last Resulted: 02/15/15 4:37 PM       Please schedule a Follow-up Appointment to:  Return in about 4 weeks (around 06/28/2020) for 4 weeks  follow-up Mood/Anxiety PHQ/GAD score, med adjust.  If you have any other questions or concerns, please feel free to call the office or send a message through MyChart. You may also schedule an earlier appointment if necessary.  Additionally, you may be receiving a survey about your experience at our office within a few days to 1 week by e-mail or mail. We value your feedback.  Saralyn Pilar, DO [facility], [place]

## 2020-05-31 NOTE — Progress Notes (Signed)
Subjective:    Patient ID: Melissa Nunez, female    DOB: 23-Jul-1985, 35 y.o.   MRN: [id number]  Melissa Nunez is a 35 y.o. female presenting on 05/31/2020 for Establish Care and Depression  Previous PCP at [facility], husband has transferred care here and she is doing same.  HPI   Depression, Postpartum history Previously history 2017 following pregnancy and birth of her son, she had postpartum depression and treated with Zoloft with some good results for period of time. She has done well with mood without medication for years now. Now worsening mood gradual problem and onset following cholecystectomy that has impacted her, with weight gain and lifestyle. Now has depression symptoms returned and comorbid anxiety issues.  History of Cholecystectomy Morbid Obesity BMI >49  OBGYN Followed by Shirlyn Goltz - last had pap smear 2016, had pregnancy in 2017. Last pap result, copied below, ASCUS and negative HPV 01/2015. She has not returned to them for repeat.  Health Maintenance: Updating from chart.  UTD COVID vaccine, request copy of card / dates. Moderna 09/2019  Depression screen PHQ 2/9 05/31/2020  Decreased Interest 2  Down, Depressed, Hopeless 2  PHQ - 2 Score 4  Altered sleeping 3  Tired, decreased energy 3  Change in appetite 3  Feeling bad or failure about yourself  2  Trouble concentrating 2  Moving slowly or fidgety/restless 2  Suicidal thoughts 1  PHQ-9 Score 20  Difficult doing work/chores Very difficult   [place]-Suicide Severity Rating Scale 1) Have you wished you were dead or wished you could go to sleep and not wake up? - Yes - passive occasional ideation. No intent to harm self.  2) Have you had any actual thoughts of killing yourself? - No  Skip questions 3,4, 5  6) Have you ever done anything, started to do anything, or prepared to do anything to end your life? - No   GAD 7 : Generalized Anxiety Score 05/31/2020  Nervous, Anxious,  on Edge 3  Control/stop worrying 2  Worry too much - different things 2  Trouble relaxing 2  Restless 1  Easily annoyed or irritable 3  Afraid - awful might happen 1  Total GAD 7 Score 14  Anxiety Difficulty Somewhat difficult     Past Medical History:  Diagnosis Date  . Kidney stones 2015   Past Surgical History:  Procedure Laterality Date  . [facility]  [facility]  . KIDNEY STONE SURGERY  2016   Social History   Socioeconomic History  . Marital status: Married    Spouse name: Not on file  . Number of children: Not on file  . Years of education: Not on file  . Highest education level: Not on file  Occupational History  . Not on file  Tobacco Use  . Smoking status: Never Smoker  . Smokeless tobacco: Never Used  Vaping Use  . Vaping Use: Never used  Substance and Sexual Activity  . Alcohol use: Yes    Alcohol/week: 1.0 standard drink    Types: 1 Cans of beer per week  . Drug use: No  . Sexual activity: Yes    Birth control/protection: I.U.D.  Other Topics Concern  . Not on file  Social History Narrative  . Not on file   Social Determinants of Health   Financial Resource Strain: Not on file  Food Insecurity: Not on file  Transportation Needs: Not on file  Physical Activity: Not on file  Stress: Not on file  Social Connections: Not on file  Intimate Partner Violence: Not on file   Family History  Problem Relation Age of Onset  . Hypertension Mother   . Bone cancer Mother        jaw  . Hypertension Father   . Diabetes Father   . Depression Father   . Heart disease Paternal Grandmother   . Heart attack Paternal Grandfather 12   Current Outpatient Medications on File Prior to Visit  Medication Sig  . [DISCONTINUED] loratadine (CLARITIN) 10 MG tablet Take 10 mg by mouth daily.  . [DISCONTINUED] promethazine (PHENERGAN) 25 MG tablet Take 25 mg by mouth every 6 (six) hours as needed for nausea or vomiting.   No current  facility-administered medications on file prior to visit.    Review of Systems Per HPI unless specifically indicated above       Objective:    BP (!) 145/81   Pulse 85   Ht 5\' 4"  (1.626 m)   Wt 288 lb 9.6 oz (130.9 kg)   SpO2 99%   BMI 49.54 kg/m   Wt Readings from Last 3 Encounters:  05/31/20 288 lb 9.6 oz (130.9 kg)  02/03/20 280 lb (127 kg)  10/16/19 280 lb (127 kg)    Physical Exam Vitals and nursing note reviewed.  Constitutional:      General: She is not in acute distress.    Appearance: She is well-developed. She is obese. She is not diaphoretic.     Comments: Well-appearing, comfortable, cooperative  HENT:     Head: Normocephalic and atraumatic.  Eyes:     General:        Right eye: No discharge.        Left eye: No discharge.     Conjunctiva/sclera: Conjunctivae normal.  Cardiovascular:     Rate and Rhythm: Normal rate.  Pulmonary:     Effort: Pulmonary effort is normal.  Skin:    General: Skin is warm and dry.     Findings: No erythema or rash.  Neurological:     Mental Status: She is alert and oriented to person, place, and time.  Psychiatric:        Behavior: Behavior normal.     Comments: Well groomed, good eye contact, normal speech and thoughts      Pap IG, HPV-hr - [facility] Specimen:  Genital - Cervix uteri structure (body structure)  Ref Range & Units 5 yr ago Comments  DIAGNOSIS: - [facility]  CommentAbnormal  EPITHELIAL CELL ABNORMALITY.  ATYPICAL SQUAMOUS CELLS OF UNDETERMINED SIGNIFICANCE.  Recommendation: - [facility]  CommentAbnormal  Suggest follow up as clinically appropriate.  Specimen adequacy: - [facility]  Comment  Satisfactory for evaluation. Endocervical and/or squamous metaplastic  cells (endocervical component) are present.  Clinician provided ICD10: - [facility]  Comment  Z11.51  Z12.4  PERFORMED BY: - [facility]  Comment  12/16/19, Cytotechnologist (ASCP)  QC reviewed by: - [facility]  Comment  Nadyne Coombes, Cytotechnologist  (ASCP)  Electronically signed by: - [facility]  Comment  Myrla Halsted, MD, Pathologist  . - [facility]  .    Pathologist provided ICD10: - [facility]  Comment  R87.610  Note: - [facility]  Comment  The Pap smear is a screening test designed to aid in the detection of  premalignant and malignant conditions of the uterine cervix. It is not a  diagnostic procedure and should not be used as the sole means of detecting  cervical cancer. Both false-positive and false-negative  reports do occur.  Test Methodology: - [facility]  Comment  This liquid based ThinPrep(R) pap test was screened with the  use of an image guided system.  HPV, high-risk - [facility] Negative Negative  This high-risk HPV test detects thirteen high-risk types  (16/18/31/33/35/39/45/51/52/56/58/59/68) without differentiation.  Resulting Agency  [facility]ec[facility] Chute    Narrative Performed by Raechel Chute Performed at: [address]  [address], [place], [place] [id number]  Lab Director: Fransico Michael MD, Phone: [phone]  Performed at: [address] [place]  [address] [place], [place] [id number]  Lab Director: Mila Homer MD, Phone: [phone]  Performed at: [address]  [address] [place], [place] [id number]  Lab Director: Mila Homer MD, Phone: [phone]  Specimen Comment: Source............Marland KitchenEndocervix  Specimen Comment: No. of containers.Marland Kitchen01 CYTYC Thin Prep Vial Specimen Collected: 02/08/15 10:18 AM Last Resulted: 02/15/15 4:37 PM      Results for orders placed or performed in visit on 05/31/20  HM PAP SMEAR  Result Value Ref Range   HM Pap smear ASCUS, negative HPV       Assessment & Plan:   Problem List Items Addressed This Visit    Recurrent moderate major depressive disorder with anxiety (HCC) - Primary   Relevant Medications   sertraline (ZOLOFT) 50 MG tablet   Morbid obesity with BMI of 45.0-49.9, adult (HCC)    Other Visit Diagnoses     Encounter to establish care with new doctor          Review outside records from previous provider  Abstract last Pap ASCUS 2016, asked her to call her [facility] to schedule follow-up and repeat pap  #Depression, recurrent, moderate W/ comorbid anxiety  Repeat course on Sertraline 50mg  daily, dose titrate up if needed to 75-100mg  in future F/u 4-6 weeks dose adjust and review other options, referral therapist if needed or can do self referral, handout given  Meds ordered this encounter  Medications  . sertraline (ZOLOFT) 50 MG tablet    Sig: Take 1 tablet (50 mg total) by mouth daily.    Dispense:  90 tablet    Refill:  1      Follow up plan: Return in about 4 weeks (around 06/28/2020) for 4 weeks follow-up Mood/Anxiety PHQ/GAD score, med adjust.  Can do telemedicine / virtual visit.  06/30/2020, DO [facility] 05/31/2020, 10:26 AM

## 2020-06-28 ENCOUNTER — Telehealth: Payer: 59 | Admitting: Family Medicine

## 2020-06-28 ENCOUNTER — Other Ambulatory Visit: Payer: Self-pay

## 2020-06-28 ENCOUNTER — Encounter: Payer: Self-pay | Admitting: Family Medicine

## 2020-06-28 ENCOUNTER — Telehealth (INDEPENDENT_AMBULATORY_CARE_PROVIDER_SITE_OTHER): Payer: 59 | Admitting: Family Medicine

## 2020-06-28 VITALS — Ht 64.0 in | Wt 280.0 lb

## 2020-06-28 DIAGNOSIS — F419 Anxiety disorder, unspecified: Secondary | ICD-10-CM | POA: Diagnosis not present

## 2020-06-28 DIAGNOSIS — F331 Major depressive disorder, recurrent, moderate: Secondary | ICD-10-CM | POA: Diagnosis not present

## 2020-06-28 MED ORDER — SERTRALINE HCL 100 MG PO TABS: 100.0000 mg | ORAL_TABLET | Freq: Every day | ORAL | 1 refills | Status: DC

## 2020-06-28 NOTE — Progress Notes (Signed)
Virtual Visit via Telephone The purpose of this virtual visit is to provide medical care while limiting exposure to the novel coronavirus (COVID19) for both patient and office staff.  Consent was obtained for phone visit:  Yes.   Answered questions that patient had about telehealth interaction:  Yes.   I discussed the limitations, risks, security and privacy concerns of performing an evaluation and management service by telephone. I also discussed with the patient that there may be a patient responsible charge related to this service. The patient expressed understanding and agreed to proceed.  Patient Location: Home Provider Location: [facility] (Office)  Participants in virtual visit: - Patient: Melissa Nunez. Artis Flock - CMA: Burnell Blanks, CMA - Provider: Dr [name]  ---------------------------------------------------------------------- Chief Complaint  Patient presents with  . Anxiety  . Depression    S: Reviewed CMA documentation. I have called patient and gathered additional HPI as follows:  Depression, Postpartum history Anxiety  - Last visit with me 05/31/20, for initial visit for same problem establish care managed depression and anxiety, treated with Sertraline 50mg  daily, see prior notes for background information. - Interval update with improved on SSRI therapy, says about 50% improved mostly improves her anxiety but helps mood but unresolved, still has some good days and bad days.  - Today patient reports she is interested in next step with dosage on Sertraline.  Previously history 2017 following pregnancy and birth of her son, she had postpartum depression and treated with Zoloft with some good results for period of time. She has done well with mood without medication for years now.  Denies any fevers, chills, sweats, body ache, cough, shortness of breath, sinus pain or pressure, headache, abdominal pain, diarrhea  Past Medical History:  Diagnosis Date   . Kidney stones 2015   Social History   Tobacco Use  . Smoking status: Never Smoker  . Smokeless tobacco: Never Used  Vaping Use  . Vaping Use: Never used  Substance Use Topics  . Alcohol use: Yes    Alcohol/week: 1.0 standard drink    Types: 1 Cans of beer per week  . Drug use: No    Current Outpatient Medications:  .  sertraline (ZOLOFT) 100 MG tablet, Take 1 tablet (100 mg total) by mouth daily., Disp: 90 tablet, Rfl: 1  Depression screen [facility] 2/9 06/28/2020 05/31/2020  Decreased Interest 2 2  Down, Depressed, Hopeless 3 2  PHQ - 2 Score 5 4  Altered sleeping 3 3  Tired, decreased energy 3 3  Change in appetite 2 3  Feeling bad or failure about yourself  2 2  Trouble concentrating 1 2  Moving slowly or fidgety/restless 1 2  Suicidal thoughts 0 1  PHQ-9 Score 17 20  Difficult doing work/chores Not difficult at all Very difficult    GAD 7 : Generalized Anxiety Score 06/28/2020 05/31/2020  Nervous, Anxious, on Edge 1 3  Control/stop worrying 1 2  Worry too much - different things 0 2  Trouble relaxing 3 2  Restless 0 1  Easily annoyed or irritable 1 3  Afraid - awful might happen 0 1  Total GAD 7 Score 6 14  Anxiety Difficulty Not difficult at all Somewhat difficult    -------------------------------------------------------------------------- O: No physical exam performed due to remote telephone encounter.  Lab results reviewed.  No results found for this or any previous visit (from the past 2160 hour(s)).  -------------------------------------------------------------------------- A&P:  Problem List Items Addressed This Visit    Recurrent moderate  major depressive disorder with anxiety (HCC) - Primary   Relevant Medications   sertraline (ZOLOFT) 100 MG tablet       #Depression, recurrent, moderate W/ comorbid anxiety  Improved anxiety significantly. Improved mood partially still has recurrent problem moderate symptoms some days not all.  Dose increase  Sertraline from 50 to 100mg , new rx sent. 90 day  Recommend other options w/ therapist in future we can help with referral or she can locate provider if interested  Also we discussed add on such as Trazodone for insomnia in future or Wellbutrin for mood, we can review further if still indicated.    Meds ordered this encounter  Medications  . sertraline (ZOLOFT) 100 MG tablet    Sig: Take 1 tablet (100 mg total) by mouth daily.    Dispense:  90 tablet    Refill:  1    Dose increase from 50 to 100mg     Follow-up: - Return in 2 months for Annual Physical Labs ordered AT TIME of next visit, fasting - after visit  Patient verbalizes understanding with the above medical recommendations including the limitation of remote medical advice.  Specific follow-up and call-back criteria were given for patient to follow-up or seek medical care more urgently if needed.   - Time spent in direct consultation with patient on phone: 12 minutes   , DO [facility] 06/28/2020, 2:18 PM

## 2020-06-28 NOTE — Patient Instructions (Addendum)
  Dose increase Sertraline 50 to 100mg  daily for mood / anxiety  We can consider future Trazodone add on for sleep in future and mood.  Also consider Wellbutrin for mood only, not for sleep or anxiety  Please schedule a Follow-up Appointment to: Return in about 2 months (around 08/28/2020) for 2 months Annual Physical (fasting lab only in AM after apt).  If you have any other questions or concerns, please feel free to call the office or send a message through MyChart. You may also schedule an earlier appointment if necessary.  Additionally, you may be receiving a survey about your experience at our office within a few days to 1 week by e-mail or mail. We value your feedback.  08/30/2020, DO [facility], [facility]

## 2020-12-28 ENCOUNTER — Other Ambulatory Visit: Payer: Self-pay

## 2020-12-28 ENCOUNTER — Ambulatory Visit
Admission: EM | Admit: 2020-12-28 | Discharge: 2020-12-28 | Disposition: A | Discharge status: Home / Self Care | Payer: 59 | Attending: Medical Oncology | Admitting: Medical Oncology

## 2020-12-28 DIAGNOSIS — Z20822 Contact with and (suspected) exposure to covid-19: Secondary | ICD-10-CM | POA: Diagnosis not present

## 2020-12-28 DIAGNOSIS — J069 Acute upper respiratory infection, unspecified: Secondary | ICD-10-CM | POA: Diagnosis present

## 2020-12-28 LAB — RAPID INFLUENZA A&B ANTIGENS
Influenza A (ARMC): NEGATIVE
Influenza B (ARMC): NEGATIVE

## 2020-12-28 MED ORDER — BENZONATATE 200 MG PO CAPS: 200.0000 mg | ORAL_CAPSULE | Freq: Three times a day (TID) | ORAL | 0 refills | Status: DC | PRN

## 2020-12-28 NOTE — ED Triage Notes (Signed)
Pt c/o cough, sore throat, fatigue, nausea x 3 days. Low grade fever temp 99.7.

## 2020-12-28 NOTE — ED Provider Notes (Signed)
[facility]    CSN: [id number] Arrival date & time: 12/28/20  1259      History   Chief Complaint Chief Complaint  Patient presents with   Sore Throat   Fatigue   Fever    HPI Melissa Nunez is a 35 y.o. female who presents  With cough, ST, fatigue and nuasea with having a low grade temp today. Has not vomited. Has not been sick in the past 3 weeks. Denies HA or body aches.    Past Medical History:  Diagnosis Date   Kidney stones 2015    Patient Active Problem List   Diagnosis Date Noted   Recurrent moderate major depressive disorder with anxiety (HCC) 05/31/2020   Morbid obesity with BMI of 45.0-49.9, adult (HCC) 05/31/2020    Past Surgical History:  Procedure Laterality Date   [facility]  [facility]   KIDNEY STONE SURGERY  2016    OB History     Gravida  2   Para  2   Term  2   Preterm      AB      Living  1      SAB      IAB      Ectopic      Multiple  0   Live Births  1        Obstetric Comments  2 vessel cord           Home Medications    Prior to Admission medications   Medication Sig Start Date End Date Taking? Authorizing Provider  benzonatate (TESSALON) 200 MG capsule Take 1 capsule (200 mg total) by mouth 3 (three) times daily as needed for cough. 12/28/20  Yes Rodriguez-Southworth, Nettie Elm, PA-C  sertraline (ZOLOFT) 100 MG tablet Take 1 tablet (100 mg total) by mouth daily. 06/28/20   Karamalegos, Netta Neat, DO  loratadine (CLARITIN) 10 MG tablet Take 10 mg by mouth daily.  02/03/20  [provider]  promethazine (PHENERGAN) 25 MG tablet Take 25 mg by mouth every 6 (six) hours as needed for nausea or vomiting.  07/24/18  [provider]    Family History Family History  Problem Relation Age of Onset   Hypertension Mother    Bone cancer Mother        jaw   Hypertension Father    Diabetes Father    Depression Father    Heart disease Paternal Grandmother     Heart attack Paternal Grandfather 47    Social History Social History   Tobacco Use   Smoking status: Never   Smokeless tobacco: Never  Vaping Use   Vaping Use: Never used  Substance Use Topics   Alcohol use: Yes    Alcohol/week: 1.0 standard drink    Types: 1 Cans of beer per week   Drug use: No     Allergies   Patient has no known allergies.   Review of Systems Review of Systems  Constitutional:  Positive for chills, fatigue and fever. Negative for appetite change.  HENT:  Positive for congestion, postnasal drip, rhinorrhea and sore throat. Negative for ear discharge and ear pain.   Eyes:  Negative for discharge.  Respiratory:  Positive for cough. Negative for shortness of breath and wheezing.   Skin:  Negative for rash.  Hematological:  Negative for adenopathy.    Physical Exam Triage Vital Signs ED Triage Vitals  Enc Vitals Group     BP 12/28/20 1439 (!) 152/107  Pulse Rate 12/28/20 1439 82     Resp 12/28/20 1439 16     Temp 12/28/20 1439 98.4 F (36.9 C)     Temp Source 12/28/20 1439 Oral     SpO2 12/28/20 1439 100 %     Weight 12/28/20 1430 279 lb 15.8 oz (127 kg)     Height 12/28/20 1430 5\' 4"  (1.626 m)     Head Circumference --      Peak Flow --      Pain Score 12/28/20 1438 2     Pain Loc --      Pain Edu? --      Excl. in [place]? --    No data found.  Updated Vital Signs BP (!) 152/107 (BP Location: Left Arm)   Pulse 82   Temp 98.4 F (36.9 C) (Oral)   Resp 16   Ht 5\' 4"  (1.626 m)   Wt 279 lb 15.8 oz (127 kg)   LMP 12/14/2020 (Exact Date)   SpO2 100%   BMI 48.06 kg/m   Visual Acuity Right Eye Distance:   Left Eye Distance:   Bilateral Distance:    Right Eye Near:   Left Eye Near:    Bilateral Near:     Physical Exam Physical Exam Vitals signs and nursing note reviewed.  Constitutional:      General: She is not in acute distress.    Appearance: Normal appearance. She is not ill-appearing, toxic-appearing or diaphoretic.   HENT:     Head: Normocephalic.     Right Ear: Tympanic membrane, ear canal and external ear normal.     Left Ear: Tympanic membrane, ear canal and external ear normal.     Nose: with clear rhinitis     Mouth/Throat: clear    Mouth: Mucous membranes are moist.  Eyes:     General: No scleral icterus.       Right eye: No discharge.        Left eye: No discharge.     Conjunctiva/sclera: Conjunctivae normal.  Neck:     Musculoskeletal: Neck supple. No neck rigidity.  Cardiovascular:     Rate and Rhythm: Normal rate and regular rhythm.     Heart sounds: No murmur.  Pulmonary:     Effort: Pulmonary effort is normal.     Breath sounds: Normal breath sounds.  Musculoskeletal: Normal range of motion.  Lymphadenopathy:     Cervical: No cervical adenopathy.  Skin:    General: Skin is warm and dry.     Coloration: Skin is not jaundiced.     Findings: No rash.  Neurological:     Mental Status: She is alert and oriented to person, place, and time.     Gait: Gait normal.  Psychiatric:        Mood and Affect: Mood normal.        Behavior: Behavior normal.        Thought Content: Thought content normal.        Judgment: Judgment normal.    UC Treatments / Results  Labs (all labs ordered are listed, but only abnormal results are displayed) Labs Reviewed  RAPID INFLUENZA A&B ANTIGENS  SARS CORONAVIRUS 2 (TAT 6-24 HRS)   Neg Flu A&B Covid test neg.  EKG   Radiology No results found.  Procedures Procedures (including critical care time)  Medications Ordered in UC Medications - No data to display  Initial Impression / Assessment and Plan / UC Course  I have reviewed the  triage vital signs and the nursing notes. Pertinent labs  results that were available during my care of the patient were reviewed by me and considered in my medical decision making (see chart for details). URI I sent Tessalon perless prn cough as noted.  Final Clinical Impressions(s) / UC Diagnoses   Final  diagnoses:  Upper respiratory tract infection, unspecified type   Discharge Instructions   None    ED Prescriptions     Medication Sig Dispense Auth. Provider   benzonatate (TESSALON) 200 MG capsule Take 1 capsule (200 mg total) by mouth 3 (three) times daily as needed for cough. 30 capsule Rodriguez-Southworth, Sunday Spillers, PA-C      PDMP not reviewed this encounter.   Shelby Mattocks, [place] 12/29/20 [phone]

## 2020-12-29 LAB — SARS CORONAVIRUS 2 (TAT 6-24 HRS): SARS Coronavirus 2: NEGATIVE

## 2021-01-09 ENCOUNTER — Other Ambulatory Visit: Payer: Self-pay | Admitting: Family Medicine

## 2021-01-09 DIAGNOSIS — F419 Anxiety disorder, unspecified: Secondary | ICD-10-CM

## 2021-01-11 NOTE — Telephone Encounter (Signed)
Patient will need an office visit for further refills. Courtesy refill. Requested Prescriptions  Pending Prescriptions Disp Refills  . sertraline (ZOLOFT) 100 MG tablet [Pharmacy Med Name: SERTRALINE HCL 100 MG TABLET] 30 tablet 0    Sig: TAKE 1 TABLET BY MOUTH EVERY DAY     Psychiatry:  Antidepressants - SSRI Failed - 01/09/2021 10:40 PM      Failed - Valid encounter within last 6 months    Recent Outpatient Visits          6 months ago Recurrent moderate major depressive disorder with anxiety [facility])   [facility], Netta Neat, DO   7 months ago Recurrent moderate major depressive disorder with anxiety [facility])   [facility], Netta Neat, DO             Passed - Completed PHQ-2 or PHQ-9 in the last 360 days

## 2021-01-13 ENCOUNTER — Other Ambulatory Visit: Payer: Self-pay | Admitting: Family Medicine

## 2021-01-13 DIAGNOSIS — F331 Major depressive disorder, recurrent, moderate: Secondary | ICD-10-CM

## 2021-03-12 ENCOUNTER — Emergency Department: Payer: 59

## 2021-03-12 ENCOUNTER — Emergency Department
Admission: EM | Admit: 2021-03-12 | Discharge: 2021-03-12 | Disposition: A | Discharge status: Home / Self Care | Payer: 59 | Attending: Emergency Medicine | Admitting: Emergency Medicine

## 2021-03-12 ENCOUNTER — Encounter: Payer: Self-pay | Admitting: Emergency Medicine

## 2021-03-12 ENCOUNTER — Other Ambulatory Visit: Payer: Self-pay

## 2021-03-12 DIAGNOSIS — R109 Unspecified abdominal pain: Secondary | ICD-10-CM | POA: Diagnosis present

## 2021-03-12 DIAGNOSIS — N946 Dysmenorrhea, unspecified: Secondary | ICD-10-CM | POA: Insufficient documentation

## 2021-03-12 DIAGNOSIS — R1031 Right lower quadrant pain: Secondary | ICD-10-CM

## 2021-03-12 DIAGNOSIS — N200 Calculus of kidney: Secondary | ICD-10-CM | POA: Diagnosis not present

## 2021-03-12 DIAGNOSIS — M791 Myalgia, unspecified site: Secondary | ICD-10-CM

## 2021-03-12 LAB — CBC
HCT: 39.9 % (ref 36.0–46.0)
Hemoglobin: 13 g/dL (ref 12.0–15.0)
MCH: 29.2 pg (ref 26.0–34.0)
MCHC: 32.6 g/dL (ref 30.0–36.0)
MCV: 89.7 fL (ref 80.0–100.0)
Platelets: 230 K/uL (ref 150–400)
RBC: 4.45 MIL/uL (ref 3.87–5.11)
RDW: 13.2 % (ref 11.5–15.5)
WBC: 8.4 K/uL (ref 4.0–10.5)
nRBC: 0 % (ref 0.0–0.2)

## 2021-03-12 LAB — COMPREHENSIVE METABOLIC PANEL
ALT: 30 U/L (ref 0–44)
AST: 25 U/L (ref 15–41)
Albumin: 3.3 g/dL — ABNORMAL LOW (ref 3.5–5.0)
Alkaline Phosphatase: 84 U/L (ref 38–126)
Anion gap: 5 (ref 5–15)
BUN: 12 mg/dL (ref 6–20)
CO2: 25 mmol/L (ref 22–32)
Calcium: 8.3 mg/dL — ABNORMAL LOW (ref 8.9–10.3)
Chloride: 107 mmol/L (ref 98–111)
Creatinine, Ser: 0.64 mg/dL (ref 0.44–1.00)
GFR, Estimated: >60 mL/min (ref >60)
Glucose, Bld: 106 mg/dL — ABNORMAL HIGH (ref 70–99)
Potassium: 4 mmol/L (ref 3.5–5.1)
Sodium: 137 mmol/L (ref 135–145)
Total Bilirubin: 0.7 mg/dL (ref 0.3–1.2)
Total Protein: 6.9 g/dL (ref 6.5–8.1)

## 2021-03-12 LAB — URINALYSIS, MICROSCOPIC (REFLEX)

## 2021-03-12 LAB — URINALYSIS, ROUTINE W REFLEX MICROSCOPIC
Bilirubin Urine: NEGATIVE
Glucose, UA: NEGATIVE mg/dL
Nitrite: NEGATIVE
Protein, ur: 30 mg/dL — AB
Specific Gravity, Urine: 1.02 (ref 1.005–1.030)
pH: 7 (ref 5.0–8.0)

## 2021-03-12 LAB — POC URINE PREG, ED: Preg Test, Ur: NEGATIVE

## 2021-03-12 LAB — LIPASE, BLOOD: Lipase: 29 U/L (ref 11–51)

## 2021-03-12 MED ORDER — ACETAMINOPHEN 500 MG PO TABS
1000.0000 mg | ORAL_TABLET | Freq: Once | ORAL | Status: AC
  Administered 2021-03-12: 1000 mg via ORAL
  Filled 2021-03-12: qty 2

## 2021-03-12 MED ORDER — ONDANSETRON 4 MG PO TBDP
4.0000 mg | ORAL_TABLET | Freq: Once | ORAL | Status: AC | PRN
  Administered 2021-03-12: 4 mg via ORAL
  Filled 2021-03-12: qty 1

## 2021-03-12 MED ORDER — IOHEXOL 300 MG/ML  SOLN
100.0000 mL | Freq: Once | INTRAMUSCULAR | Status: AC | PRN
  Administered 2021-03-12: 100 mL via INTRAVENOUS

## 2021-03-12 MED ORDER — LIDOCAINE 5 % EX PTCH: 1.0000 | MEDICATED_PATCH | Freq: Two times a day (BID) | CUTANEOUS | 0 refills | Status: AC

## 2021-03-12 MED ORDER — IBUPROFEN 600 MG PO TABS: 600.0000 mg | ORAL_TABLET | Freq: Four times a day (QID) | ORAL | 0 refills | Status: AC | PRN

## 2021-03-12 NOTE — Discharge Instructions (Addendum)
Take Tylenol 1 g every 8 hours and alternate with the ibuprofen with food.  She can put the patch on there.  If symptoms are getting worse develop fevers or any other concerns he knows return to the ER for repeat evaluation otherwise he can follow-up with GI if things are not getting better  IMPRESSION:  1. No acute findings within the abdomen or pelvis. No findings to  account for the patient's pain. Normal appendix visualized.  2. Single small nonobstructing stones in each kidney. No ureteral  stones or obstructive uropathy.

## 2021-03-12 NOTE — ED Provider Notes (Signed)
[facility] Provider Note    None    (approximate)   History   Abdominal Pain   HPI  Melissa Nunez is a 36 y.o. female  with depression who comes in with abdominal pain.   Pt reports 2 days ago developing pain. The pain is worse moving around. Has some associated nausea. R mid abdomen is the pain. Denies having this before. No vomiting. No diarrhea. No fevers. No dysuria.  Started 2 days ago her menstruation. Does not get heavy period cramps previously. No vaginal discharge. No new sexual partners. Gallbladder removed in 2021. Prior Kidney stones removed. Denies any SI.   Physical Exam   Triage Vital Signs: ED Triage Vitals  Enc Vitals Group     BP 03/12/21 0704 (!) 142/81     Pulse Rate 03/12/21 0704 69     Resp 03/12/21 0704 18     Temp 03/12/21 0704 98.1 F (36.7 C)     Temp Source 03/12/21 0704 Oral     SpO2 03/12/21 0704 97 %     Weight 03/12/21 0705 250 lb (113.4 kg)     Height 03/12/21 0705 5\' 4"  (1.626 m)     Head Circumference --      Peak Flow --      Pain Score 03/12/21 0705 7     Pain Loc --      Pain Edu? --      Excl. in [place]? --     Most recent vital signs: Vitals:   03/12/21 0704  BP: (!) 142/81  Pulse: 69  Resp: 18  Temp: 98.1 F (36.7 C)  SpO2: 97%     General: Awake, no distress.  CV:  Good peripheral perfusion.  Resp:  Normal effort.  Abd:  No distention. Tender R mid abdomen. NO rash. Small scratch pt states is from her belt. Other:  No leg swelling   ED Results / Procedures / Treatments   Labs (all labs ordered are listed, but only abnormal results are displayed) Labs Reviewed  COMPREHENSIVE METABOLIC PANEL - Abnormal; Notable for the following components:      Result Value   Glucose, Bld 106 (*)    Calcium 8.3 (*)    Albumin 3.3 (*)    All other components within normal limits  URINALYSIS, ROUTINE W REFLEX MICROSCOPIC - Abnormal; Notable for the following components:   APPearance HAZY (*)    Hgb  urine dipstick LARGE (*)    Ketones, ur TRACE (*)    Protein, ur 30 (*)    Leukocytes,Ua SMALL (*)    All other components within normal limits  URINALYSIS, MICROSCOPIC (REFLEX) - Abnormal; Notable for the following components:   Bacteria, UA RARE (*)    All other components within normal limits  LIPASE, BLOOD  CBC  POC URINE PREG, ED    RADIOLOGY I have reviewed the CT personally and waiting for final radiology read    PROCEDURES:  Critical Care performed: No  Procedures   MEDICATIONS ORDERED IN ED: Medications  ondansetron (ZOFRAN-ODT) disintegrating tablet 4 mg (has no administration in time range)     IMPRESSION / MDM / ASSESSMENT AND PLAN / ED COURSE  I reviewed the triage vital signs and the nursing notes.                              Differential diagnosis includes, but is not limited to, kidney  stone, appendicitis, seems higher to be ovarian issue but consider if CT shows cyst will get [place]. No symptoms to suggest PID.   Tylenol given for pain given pt is driving.   Preg test negative CMP overall re-assuring CBC no anemia/no elevated white count UA hard to interpret given on period but denies symptoms of UTI Lipase normal   IMPRESSION:  1. No acute findings within the abdomen or pelvis. No findings to  account for the patient's pain. Normal appendix visualized.  2. Single small nonobstructing stones in each kidney. No ureteral  stones or obstructive uropathy.   CT scan is reassuring.  Reevaluated patient updated on the results.  Discussed the incidental findings and provided a copy of report.  Patient starts crying just stating that she is worried there is something else going on.  Had a lengthy conversation with patient that given the CT shows normal ovaries and her pain is up higher does not seem to be related to her ovaries. WE discussed TV US but given low utility okay with holding off.  I offered her pelvic exam to evaluate for STDs but she declined given  no symptoms and no new contacts.  She denies any SI and states that she feels safe going home she is just frustrated on why she is having the pain.  She reports no significant pain with sitting but more pain with moving around.  Discussed with patient seems more musculoskeletal in nature.  Patient is going to continue to monitor her her symptoms at home and can follow-up with GI if she continues to have these issues.  We will start her on ibuprofen, Tylenol, lidocaine patch to help with pain.  She expressed understanding and felt comfortable with plan.  I discussed the provisional nature of ED diagnosis, the treatment so far, the ongoing plan of care, follow up appointments and return precautions with the patient and any family or support people present. They expressed understanding and agreed with the plan, discharged home.      FINAL CLINICAL IMPRESSION(S) / ED DIAGNOSES   Final diagnoses:  Right lower quadrant abdominal pain  Painful menstruation  Muscle pain     Rx / DC Orders   ED Discharge Orders          Ordered    lidocaine (LIDODERM) 5 %  Every 12 hours        03/12/21 0908    ibuprofen (ADVIL) 600 MG tablet  Every 6 hours PRN        03/12/21 0908             Note:  This document was prepared using Dragon voice recognition software and may include unintentional dictation errors.   Concha Se, MD 03/12/21 [phone]

## 2021-03-12 NOTE — ED Triage Notes (Signed)
Pt arrived via POV with c/o low abd pain x 2 days, pt c/o nausea. Denies any dysuria.

## 2021-03-21 ENCOUNTER — Encounter: Payer: Self-pay | Admitting: Family Medicine

## 2021-03-21 ENCOUNTER — Telehealth (INDEPENDENT_AMBULATORY_CARE_PROVIDER_SITE_OTHER): Payer: 59 | Admitting: Family Medicine

## 2021-03-21 VITALS — Wt 250.0 lb

## 2021-03-21 DIAGNOSIS — F419 Anxiety disorder, unspecified: Secondary | ICD-10-CM | POA: Diagnosis not present

## 2021-03-21 DIAGNOSIS — F331 Major depressive disorder, recurrent, moderate: Secondary | ICD-10-CM

## 2021-03-21 MED ORDER — SERTRALINE HCL 100 MG PO TABS: 100.0000 mg | ORAL_TABLET | Freq: Every day | ORAL | 3 refills | Status: DC

## 2021-03-21 NOTE — Progress Notes (Signed)
Virtual Visit via Telephone The purpose of this virtual visit is to provide medical care while limiting exposure to the novel coronavirus (COVID19) for both patient and office staff.  Consent was obtained for phone visit:  Yes.   Answered questions that patient had about telehealth interaction:  Yes.   I discussed the limitations, risks, security and privacy concerns of performing an evaluation and management service by telephone. I also discussed with the patient that there may be a patient responsible charge related to this service. The patient expressed understanding and agreed to proceed.  Patient Location: Home Provider Location: [facility] (Office)  Participants in virtual visit: - Patient: Melissa Nunez - CMA: Orinda Kenner, [place] - Provider: Dr Parks Ranger  ---------------------------------------------------------------------- Chief Complaint  Patient presents with   Depression    S: Reviewed CMA documentation. I have called patient and gathered additional HPI as follows:   Depression, recurrent moderate, Postpartum history Anxiety  - Last visit with me 05/2020 and 06/2020, for initial visit for same problem establish care managed depression and anxiety, treated with Sertraline 50mg  daily, see prior notes for background information.  She had been improved significantly on Sertraline SSRI therapy. Her dose was increased from 50 to 100mg .    Previously history 2017 following pregnancy and birth of her son, she had postpartum depression and treated with Zoloft with some good results for period of time. She has done well with mood without medication for years  Update today here in follow-up, she ran out of med since not refilled due to had not returned in >6 months. Out of med for several days to weeks now  She would like to resume medication Admits mood overall improved.  Denies any known or suspected exposure to person with or possibly with  COVID19.  Denies any fevers, chills, sweats, body ache, cough, shortness of breath, sinus pain or pressure, headache, abdominal pain, diarrhea  Past Medical History:  Diagnosis Date   Kidney stones 2015   Social History   Tobacco Use   Smoking status: Never   Smokeless tobacco: Never  Vaping Use   Vaping Use: Never used  Substance Use Topics   Alcohol use: Yes    Alcohol/week: 1.0 standard drink    Types: 1 Cans of beer per week   Drug use: No    Current Outpatient Medications:    sertraline (ZOLOFT) 100 MG tablet, Take 1 tablet (100 mg total) by mouth daily., Disp: 90 tablet, Rfl: 3  Depression screen [facility] 2/9 03/21/2021 06/28/2020 05/31/2020  Decreased Interest 2 2 2   Down, Depressed, Hopeless 3 3 2   PHQ - 2 Score 5 5 4   Altered sleeping 3 3 3   Tired, decreased energy 3 3 3   Change in appetite 2 2 3   Feeling bad or failure about yourself  2 2 2   Trouble concentrating 1 1 2   Moving slowly or fidgety/restless 0 1 2  Suicidal thoughts 0 0 1  PHQ-9 Score 16 17 20   Difficult doing work/chores Somewhat difficult Not difficult at all Very difficult    GAD 7 : Generalized Anxiety Score 03/21/2021 06/28/2020 05/31/2020  Nervous, Anxious, on Edge 1 1 3   Control/stop worrying 1 1 2   Worry too much - different things 0 0 2  Trouble relaxing 3 3 2   Restless 0 0 1  Easily annoyed or irritable 1 1 3   Afraid - awful might happen 0 0 1  Total GAD 7 Score 6 6 14   Anxiety Difficulty  Not difficult at all Not difficult at all Somewhat difficult    -------------------------------------------------------------------------- O: No physical exam performed due to remote telephone encounter.  Lab results reviewed.  Recent Results (from the past 2160 hour(s))  Rapid Influenza A&B Antigens     Status: None   Collection Time: 12/28/20  2:46 PM   Specimen: Respiratory  Result Value Ref Range   Influenza A ([facility]) NEGATIVE NEGATIVE   Influenza B ([facility]) NEGATIVE NEGATIVE    Comment: Negative  results do not exclude influenza virus infection, and influenza should still be considered if clinical suspicion is high. Performed at [facility], [address], [place], [place] [address]   SARS CORONAVIRUS 2 (TAT 6-24 HRS) Nasopharyngeal Nasopharyngeal Swab     Status: None   Collection Time: 12/28/20  3:48 PM   Specimen: Nasopharyngeal Swab  Result Value Ref Range   SARS Coronavirus 2 NEGATIVE NEGATIVE    Comment: (NOTE) SARS-CoV-2 target nucleic acids are NOT DETECTED.  The SARS-CoV-2 RNA is generally detectable in upper and lower respiratory specimens during the acute phase of infection. Negative results do not preclude SARS-CoV-2 infection, do not rule out co-infections with other pathogens, and should not be used as the sole basis for treatment or other patient management decisions. Negative results must be combined with clinical observations, patient history, and epidemiological information. The expected result is Negative.  Fact Sheet for Patients: HairSlick.no  Fact Sheet for Healthcare Providers: quierodirigir.com  This test is not yet approved or cleared by the [place] FDA and  has been authorized for detection and/or diagnosis of SARS-CoV-2 by FDA under an Emergency Use Authorization (EUA). This EUA will remain  in effect (meaning this test can be used) for the duration of the COVID-19 declaration under Se ction 564(b)(1) of the Act, 21 U.S.C. section 360bbb-3(b)(1), unless the authorization is terminated or revoked sooner.  Performed at [facility], [address] [place], [place] [address]   Lipase, blood     Status: None   Collection Time: 03/12/21  7:07 AM  Result Value Ref Range   Lipase 29 11 - 51 U/L    Comment: Performed at [facility], [address] [place], [place] [address]  Comprehensive metabolic panel     Status: Abnormal   Collection Time: 03/12/21   7:07 AM  Result Value Ref Range   Sodium 137 135 - 145 mmol/L   Potassium 4.0 3.5 - 5.1 mmol/L   Chloride 107 98 - 111 mmol/L   CO2 25 22 - 32 mmol/L   Glucose, Bld 106 (H) 70 - 99 mg/dL    Comment: Glucose reference range applies only to samples taken after fasting for at least 8 hours.   BUN 12 6 - 20 mg/dL   Creatinine, Ser 0.30 0.44 - 1.00 mg/dL   Calcium 8.3 (L) 8.9 - 10.3 mg/dL   Total Protein 6.9 6.5 - 8.1 g/dL   Albumin 3.3 (L) 3.5 - 5.0 g/dL   AST 25 15 - 41 U/L   ALT 30 0 - 44 U/L   Alkaline Phosphatase 84 38 - 126 U/L   Total Bilirubin 0.7 0.3 - 1.2 mg/dL   GFR, Estimated >09 >23 mL/min    Comment: (NOTE) Calculated using the CKD-EPI Creatinine Equation (2021)    Anion gap 5 5 - 15    Comment: Performed at [facility], [address], [place], [place] [address]  CBC     Status: None  Collection Time: 03/12/21  7:07 AM  Result Value Ref Range   WBC 8.4 4.0 - 10.5 K/uL   RBC 4.45 3.87 - 5.11 MIL/uL   Hemoglobin 13.0 12.0 - 15.0 g/dL   HCT 39.9 36.0 - 46.0 %   MCV 89.7 80.0 - 100.0 fL   MCH 29.2 26.0 - 34.0 pg   MCHC 32.6 30.0 - 36.0 g/dL   RDW 13.2 11.5 - 15.5 %   Platelets 230 150 - 400 K/uL   nRBC 0.0 0.0 - 0.2 %    Comment: Performed at [facility], [address] [place], [place] [address]  Urinalysis, Routine w reflex microscopic Urine, Clean Catch     Status: Abnormal   Collection Time: 03/12/21  7:07 AM  Result Value Ref Range   Color, Urine YELLOW YELLOW   APPearance HAZY (A) CLEAR   Specific Gravity, Urine 1.020 1.005 - 1.030   pH 7.0 5.0 - 8.0   Glucose, UA NEGATIVE NEGATIVE mg/dL   Hgb urine dipstick LARGE (A) NEGATIVE   Bilirubin Urine NEGATIVE NEGATIVE   Ketones, ur TRACE (A) NEGATIVE mg/dL   Protein, ur 30 (A) NEGATIVE mg/dL   Nitrite NEGATIVE NEGATIVE   Leukocytes,Ua SMALL (A) NEGATIVE    Comment: Performed at [facility], [address] [place], [place] [address]  Urinalysis, Microscopic (reflex)      Status: Abnormal   Collection Time: 03/12/21  7:07 AM  Result Value Ref Range   RBC / HPF 0-5 0 - 5 RBC/hpf   WBC, UA 21-50 0 - 5 WBC/hpf   Bacteria, UA RARE (A) NONE SEEN   Squamous Epithelial / LPF 21-50 0 - 5   WBC Clumps PRESENT    Mucus PRESENT     Comment: Performed at [facility], [address] [place], [place] [address]  POC urine preg, ED     Status: None   Collection Time: 03/12/21  7:12 AM  Result Value Ref Range   Preg Test, Ur Negative Negative    -------------------------------------------------------------------------- A&P:  Problem List Items Addressed This Visit     Recurrent moderate major depressive disorder with anxiety (HCC) - Primary   Relevant Medications   sertraline (ZOLOFT) 100 MG tablet   Controlled on SSRI Still persistent symptoms, now worse ran out of med for period of time Will re order Sertraline 100mg  daily now, added 90 day with refills up to 1 year, encouraged her to follow up regularly and notify us if running low on med for refills   Meds ordered this encounter  Medications   sertraline (ZOLOFT) 100 MG tablet    Sig: Take 1 tablet (100 mg total) by mouth daily.    Dispense:  90 tablet    Refill:  3    Follow-up: - Return in 6 months for Annual Physical + labs  Patient verbalizes understanding with the above medical recommendations including the limitation of remote medical advice.  Specific follow-up and call-back criteria were given for patient to follow-up or seek medical care more urgently if needed.   - Time spent in direct consultation with patient on phone: 7 minutes   Nobie Putnam, [facility] 03/21/2021, 4:47 PM

## 2021-03-21 NOTE — Patient Instructions (Addendum)
Refilled Sertraline for 90 day with refills  DUE for FASTING BLOOD WORK (no food or drink after midnight before the lab appointment, only water or coffee without cream/sugar on the morning of)  SCHEDULE "Lab Only" visit in the morning at the clinic for lab draw in 6 MONTHS   - Make sure Lab Only appointment is at about 1 week before your next appointment, so that results will be available  For Lab Results, once available within 2-3 days of blood draw, you can can log in to MyChart online to view your results and a brief explanation. Also, we can discuss results at next follow-up visit.   Please schedule a Follow-up Appointment to: Return in about 6 months (around 09/18/2021) for 6 month Annual Physical labs before or AFTER same day.  If you have any other questions or concerns, please feel free to call the office or send a message through Micro. You may also schedule an earlier appointment if necessary.  Additionally, you may be receiving a survey about your experience at our office within a few days to 1 week by e-mail or mail. We value your feedback.  Nobie Putnam, DO [place]

## 2021-03-22 ENCOUNTER — Encounter: Payer: Self-pay | Admitting: Family Medicine

## 2021-03-28 ENCOUNTER — Ambulatory Visit: Payer: 59 | Admitting: Family Medicine

## 2021-05-30 ENCOUNTER — Encounter: Payer: Self-pay | Admitting: Family Medicine

## 2021-05-30 ENCOUNTER — Ambulatory Visit (INDEPENDENT_AMBULATORY_CARE_PROVIDER_SITE_OTHER): Payer: BC Managed Care – PPO | Admitting: Family Medicine

## 2021-05-30 VITALS — BP 126/82 | HR 75 | Ht 64.0 in | Wt 293.0 lb

## 2021-05-30 DIAGNOSIS — F331 Major depressive disorder, recurrent, moderate: Secondary | ICD-10-CM

## 2021-05-30 DIAGNOSIS — Z6841 Body Mass Index (BMI) 40.0 and over, adult: Secondary | ICD-10-CM

## 2021-05-30 DIAGNOSIS — F419 Anxiety disorder, unspecified: Secondary | ICD-10-CM | POA: Diagnosis not present

## 2021-05-30 MED ORDER — SAXENDA 18 MG/3ML ~~LOC~~ SOPN: PEN_INJECTOR | SUBCUTANEOUS | 2 refills | Status: DC

## 2021-05-30 NOTE — Patient Instructions (Addendum)
Thank you for coming to the office today. ? ?Saxenda -start daily dose, can increase weekly as tolerated 0.6, 1.2 and 1.8, 2.4 and 3.0 side effects nausea, upset stomach higher on this one but it is still very effective medicine ? ?Goal 4% wt loss in 4 months, approx 12 lbs, goal 281 lbs ? ? ?Please schedule a Follow-up Appointment to: Return in about 4 months (around 09/29/2021) for 4 month follow-up Weight Management (med, goal 4% wt loss). ? ?If you have any other questions or concerns, please feel free to call the office or send a message through MyChart. You may also schedule an earlier appointment if necessary. ? ?Additionally, you may be receiving a survey about your experience at our office within a few days to 1 week by e-mail or mail. We value your feedback. ? ?Saralyn Pilar, DO ?[facility], [place] ?

## 2021-05-30 NOTE — Progress Notes (Signed)
? ?Subjective:  ? ? Patient ID: Melissa Nunez, female    DOB: Nov 10, 1985, 36 y.o.   MRN: [id number] ? ?Melissa Nunez is a 36 y.o. female presenting on 05/30/2021 for Obesity ? ? ?HPI ? ?Morbid Obesity BMI >50 ?She is interested in weight management medication Saxenda, her husband has used before as well. And she is interested to trial this. Goal to improve diet and lifestyle as well in conjunction ?Current weight 293 lbs, goal wt target in 4 months is down 11.7 lbs or goal 281 lbs. ? ?Admits complications due to weight ?Major Depression, anxiety recurrent associated with weight. ?Also has some knee and hip pain ? ? ? ?  05/30/2021  ?  8:42 AM 03/21/2021  ?  4:12 PM 06/28/2020  ?  1:28 PM  ?Depression screen PHQ 2/9  ?Decreased Interest 1 2 2   ?Down, Depressed, Hopeless 1 3 3   ?PHQ - 2 Score 2 5 5   ?Altered sleeping 1 3 3   ?Tired, decreased energy 2 3 3   ?Change in appetite 2 2 2   ?Feeling bad or failure about yourself  0 2 2  ?Trouble concentrating 0 1 1  ?Moving slowly or fidgety/restless 0 0 1  ?Suicidal thoughts 0 0 0  ?PHQ-9 Score 7 16 17   ?Difficult doing work/chores Not difficult at all Somewhat difficult Not difficult at all  ? ? ?Social History  ? ?Tobacco Use  ? Smoking status: Never  ? Smokeless tobacco: Never  ?Vaping Use  ? Vaping Use: Never used  ?Substance Use Topics  ? Alcohol use: Yes  ?  Alcohol/week: 1.0 standard drink  ?  Types: 1 Cans of beer per week  ? Drug use: No  ? ? ?Review of Systems ?Per HPI unless specifically indicated above ? ?   ?Objective:  ?  ?BP 126/82   Pulse 75   Ht 5\' 4"  (1.626 m)   Wt 293 lb (132.9 kg)   SpO2 98%   BMI 50.29 kg/m?   ?Wt Readings from Last 3 Encounters:  ?05/30/21 293 lb (132.9 kg)  ?03/21/21 250 lb (113.4 kg)  ?03/12/21 250 lb (113.4 kg)  ?  ?Physical Exam ?Vitals and nursing note reviewed.  ?Constitutional:   ?   General: She is not in acute distress. ?   Appearance: She is well-developed. She is obese. She is not diaphoretic.  ?   Comments:  Well-appearing, comfortable, cooperative  ?HENT:  ?   Head: Normocephalic and atraumatic.  ?Eyes:  ?   General:     ?   Right eye: No discharge.     ?   Left eye: No discharge.  ?   Conjunctiva/sclera: Conjunctivae normal.  ?Neck:  ?   Thyroid: No thyromegaly.  ?Cardiovascular:  ?   Rate and Rhythm: Normal rate and regular rhythm.  ?   Heart sounds: Normal heart sounds. No murmur heard. ?Pulmonary:  ?   Effort: Pulmonary effort is normal. No respiratory distress.  ?   Breath sounds: Normal breath sounds. No wheezing or rales.  ?Musculoskeletal:     ?   General: Normal range of motion.  ?   Cervical back: Normal range of motion and neck supple.  ?Lymphadenopathy:  ?   Cervical: No cervical adenopathy.  ?Skin: ?   General: Skin is warm and dry.  ?   Findings: No erythema or rash.  ?Neurological:  ?   Mental Status: She is alert and oriented to person, place, and time.  ?Psychiatric:     ?  Behavior: Behavior normal.  ?   Comments: Well groomed, good eye contact, normal speech and thoughts  ? ? ? ?Results for orders placed or performed during the hospital encounter of 03/12/21  ?Lipase, blood  ?Result Value Ref Range  ? Lipase 29 11 - 51 U/L  ?Comprehensive metabolic panel  ?Result Value Ref Range  ? Sodium 137 135 - 145 mmol/L  ? Potassium 4.0 3.5 - 5.1 mmol/L  ? Chloride 107 98 - 111 mmol/L  ? CO2 25 22 - 32 mmol/L  ? Glucose, Bld 106 (H) 70 - 99 mg/dL  ? BUN 12 6 - 20 mg/dL  ? Creatinine, Ser 0.64 0.44 - 1.00 mg/dL  ? Calcium 8.3 (L) 8.9 - 10.3 mg/dL  ? Total Protein 6.9 6.5 - 8.1 g/dL  ? Albumin 3.3 (L) 3.5 - 5.0 g/dL  ? AST 25 15 - 41 U/L  ? ALT 30 0 - 44 U/L  ? Alkaline Phosphatase 84 38 - 126 U/L  ? Total Bilirubin 0.7 0.3 - 1.2 mg/dL  ? GFR, Estimated >60 >60 mL/min  ? Anion gap 5 5 - 15  ?CBC  ?Result Value Ref Range  ? WBC 8.4 4.0 - 10.5 K/uL  ? RBC 4.45 3.87 - 5.11 MIL/uL  ? Hemoglobin 13.0 12.0 - 15.0 g/dL  ? HCT 39.9 36.0 - 46.0 %  ? MCV 89.7 80.0 - 100.0 fL  ? MCH 29.2 26.0 - 34.0 pg  ? MCHC 32.6 30.0 -  36.0 g/dL  ? RDW 13.2 11.5 - 15.5 %  ? Platelets 230 150 - 400 K/uL  ? nRBC 0.0 0.0 - 0.2 %  ?Urinalysis, Routine w reflex microscopic Urine, Clean Catch  ?Result Value Ref Range  ? Color, Urine YELLOW YELLOW  ? APPearance HAZY (A) CLEAR  ? Specific Gravity, Urine 1.020 1.005 - 1.030  ? pH 7.0 5.0 - 8.0  ? Glucose, UA NEGATIVE NEGATIVE mg/dL  ? Hgb urine dipstick LARGE (A) NEGATIVE  ? Bilirubin Urine NEGATIVE NEGATIVE  ? Ketones, ur TRACE (A) NEGATIVE mg/dL  ? Protein, ur 30 (A) NEGATIVE mg/dL  ? Nitrite NEGATIVE NEGATIVE  ? Leukocytes,Ua SMALL (A) NEGATIVE  ?Urinalysis, Microscopic (reflex)  ?Result Value Ref Range  ? RBC / HPF 0-5 0 - 5 RBC/hpf  ? WBC, UA 21-50 0 - 5 WBC/hpf  ? Bacteria, UA RARE (A) NONE SEEN  ? Squamous Epithelial / LPF 21-50 0 - 5  ? WBC Clumps PRESENT   ? Mucus PRESENT   ?POC urine preg, ED  ?Result Value Ref Range  ? Preg Test, Ur Negative Negative  ? ?   ?Assessment & Plan:  ? ?Problem List Items Addressed This Visit   ? ? Recurrent moderate major depressive disorder with anxiety ([place])  ? Morbid obesity with BMI of 50-50.9, adult ([place]) - Primary  ? Relevant Medications  ? SAXENDA 18 MG/3ML SOPN  ?  ?Comorbid conditions with morbid obesity BMI with major depression and anxiety related to weight. ? ?Saxenda -start daily dose, can increase weekly as tolerated 0.6, 1.2 and 1.8, 2.4 and 3.0 side effects nausea, upset stomach higher on this one but it is still very effective medicine ? ?Goal 4% wt loss in 4 months, approx 12 lbs, goal 281 lbs ? ?Meds ordered this encounter  ?Medications  ? SAXENDA 18 MG/3ML SOPN  ?  Sig: Injection 0.6 mg into skin once daily for 1 week, as tolerated increase by increment of 0.6mg  every 1 week, max dose is 3mg   injection daily after 5 weeks.  ?  Dispense:  15 mL  ?  Refill:  2  ? ? ? ? ?Follow up plan: ?Return in about 4 months (around 09/29/2021) for 4 month follow-up Weight Management (med, goal 4% wt loss). ? ? ?Nobie Putnam, DO ?[facility] ?[facility] ?05/30/2021, 8:40 AM ?

## 2021-06-17 ENCOUNTER — Emergency Department
Admission: EM | Admit: 2021-06-17 | Discharge: 2021-06-17 | Disposition: A | Discharge status: Home / Self Care | Payer: BC Managed Care – PPO | Attending: Student in an Organized Health Care Education/Training Program | Admitting: Student in an Organized Health Care Education/Training Program

## 2021-06-17 ENCOUNTER — Emergency Department: Payer: BC Managed Care – PPO

## 2021-06-17 ENCOUNTER — Other Ambulatory Visit: Payer: Self-pay

## 2021-06-17 ENCOUNTER — Encounter: Payer: Self-pay | Admitting: Emergency Medicine

## 2021-06-17 DIAGNOSIS — N132 Hydronephrosis with renal and ureteral calculous obstruction: Secondary | ICD-10-CM | POA: Diagnosis not present

## 2021-06-17 DIAGNOSIS — R109 Unspecified abdominal pain: Secondary | ICD-10-CM | POA: Diagnosis not present

## 2021-06-17 DIAGNOSIS — N390 Urinary tract infection, site not specified: Secondary | ICD-10-CM | POA: Diagnosis not present

## 2021-06-17 DIAGNOSIS — Z9049 Acquired absence of other specified parts of digestive tract: Secondary | ICD-10-CM | POA: Diagnosis not present

## 2021-06-17 DIAGNOSIS — N200 Calculus of kidney: Secondary | ICD-10-CM | POA: Diagnosis not present

## 2021-06-17 LAB — URINALYSIS, ROUTINE W REFLEX MICROSCOPIC
Bilirubin Urine: NEGATIVE
Glucose, UA: NEGATIVE mg/dL
Ketones, ur: NEGATIVE mg/dL
Nitrite: NEGATIVE
Protein, ur: 100 mg/dL — AB
RBC / HPF: >50 RBC/hpf — ABNORMAL HIGH (ref 0–5)
Specific Gravity, Urine: 1.025 (ref 1.005–1.030)
pH: 5 (ref 5.0–8.0)

## 2021-06-17 LAB — CBC
HCT: 44.5 % (ref 36.0–46.0)
Hemoglobin: 14.9 g/dL (ref 12.0–15.0)
MCH: 28.7 pg (ref 26.0–34.0)
MCHC: 33.5 g/dL (ref 30.0–36.0)
MCV: 85.7 fL (ref 80.0–100.0)
Platelets: 248 10*3/uL (ref 150–400)
RBC: 5.19 MIL/uL — ABNORMAL HIGH (ref 3.87–5.11)
RDW: 13 % (ref 11.5–15.5)
WBC: 9.4 10*3/uL (ref 4.0–10.5)
nRBC: 0 % (ref 0.0–0.2)

## 2021-06-17 LAB — BASIC METABOLIC PANEL
Anion gap: 8 (ref 5–15)
BUN: 10 mg/dL (ref 6–20)
CO2: 21 mmol/L — ABNORMAL LOW (ref 22–32)
Calcium: 8.8 mg/dL — ABNORMAL LOW (ref 8.9–10.3)
Chloride: 107 mmol/L (ref 98–111)
Creatinine, Ser: 0.73 mg/dL (ref 0.44–1.00)
GFR, Estimated: >60 mL/min (ref >60)
Glucose, Bld: 97 mg/dL (ref 70–99)
Potassium: 3.3 mmol/L — ABNORMAL LOW (ref 3.5–5.1)
Sodium: 136 mmol/L (ref 135–145)

## 2021-06-17 LAB — PREGNANCY, URINE: Preg Test, Ur: NEGATIVE

## 2021-06-17 MED ORDER — SODIUM CHLORIDE 0.9 % IV SOLN
1.0000 g | Freq: Once | INTRAVENOUS | Status: AC
  Administered 2021-06-17: 1 g via INTRAVENOUS
  Filled 2021-06-17: qty 10

## 2021-06-17 MED ORDER — ONDANSETRON HCL 4 MG/2ML IJ SOLN
4.0000 mg | Freq: Once | INTRAMUSCULAR | Status: AC
  Administered 2021-06-17: 4 mg via INTRAVENOUS
  Filled 2021-06-17: qty 2

## 2021-06-17 MED ORDER — MORPHINE SULFATE (PF) 4 MG/ML IV SOLN
4.0000 mg | Freq: Once | INTRAVENOUS | Status: AC
  Administered 2021-06-17: 4 mg via INTRAVENOUS
  Filled 2021-06-17: qty 1

## 2021-06-17 MED ORDER — TAMSULOSIN HCL 0.4 MG PO CAPS: 0.4000 mg | ORAL_CAPSULE | Freq: Every day | ORAL | 0 refills | Status: AC

## 2021-06-17 MED ORDER — TAMSULOSIN HCL 0.4 MG PO CAPS
0.4000 mg | ORAL_CAPSULE | Freq: Once | ORAL | Status: AC
  Administered 2021-06-17: 0.4 mg via ORAL
  Filled 2021-06-17: qty 1

## 2021-06-17 MED ORDER — SODIUM CHLORIDE 0.9 % IV BOLUS
1000.0000 mL | Freq: Once | INTRAVENOUS | Status: AC
  Administered 2021-06-17: 1000 mL via INTRAVENOUS

## 2021-06-17 MED ORDER — OXYCODONE HCL 5 MG PO TABS: 5.0000 mg | ORAL_TABLET | Freq: Three times a day (TID) | ORAL | 0 refills | Status: AC | PRN

## 2021-06-17 MED ORDER — ONDANSETRON 4 MG PO TBDP: 4.0000 mg | ORAL_TABLET | Freq: Three times a day (TID) | ORAL | 0 refills | Status: DC | PRN

## 2021-06-17 MED ORDER — CEPHALEXIN 500 MG PO CAPS: 500.0000 mg | ORAL_CAPSULE | Freq: Three times a day (TID) | ORAL | 0 refills | Status: AC

## 2021-06-17 MED ORDER — METOCLOPRAMIDE HCL 5 MG/ML IJ SOLN
10.0000 mg | Freq: Once | INTRAMUSCULAR | Status: AC
  Administered 2021-06-17: 10 mg via INTRAVENOUS
  Filled 2021-06-17: qty 2

## 2021-06-17 MED ORDER — KETOROLAC TROMETHAMINE 30 MG/ML IJ SOLN
15.0000 mg | Freq: Once | INTRAMUSCULAR | Status: AC
  Administered 2021-06-17: 15 mg via INTRAVENOUS
  Filled 2021-06-17: qty 1

## 2021-06-17 NOTE — ED Notes (Signed)
Patient transported to CT 

## 2021-06-17 NOTE — ED Notes (Signed)
Pt verbalized understanding of discharge instructions, follow-up care instructions, and prescriptions. Pt advised if symptoms worsen to return to ED.  ?

## 2021-06-17 NOTE — ED Notes (Signed)
Pt presents to ED with c/o of left sided flank pain that started today. Pt states she has a hx of kidney stones and had to get one removed about 2-3 years ago. Pt alos c/o NV x3 that started today as well. Pt tearful at this time.  ?

## 2021-06-17 NOTE — Discharge Instructions (Signed)
Your exam reveals small nonobstructive stones in the left ureter and the right kidney.  Your urine is consistent with a UTI.  You should take the nausea medicine, and pain medicine as needed, and the antibiotic as directed.  Follow-up with your primary provider or urology for ongoing symptoms. ?

## 2021-06-17 NOTE — ED Provider Notes (Signed)
? ? ?[facility] ?Emergency Department Provider Note ? ? ? ? Event Date/Time  ? First MD Initiated Contact with Patient 06/17/21 1702   ?  (approximate) ? ? ?History  ? ?Flank Pain, Nausea, and Emesis ? ? ?HPI ? ?Melissa Nunez is a 36 y.o. female with a history of depression, anxiety, morbid obesity, heavy stones, presents to the ED with acute left flank pain.  Patient reports onset about an hour prior to arrival.  She reports some associated nausea and vomiting.  She denies any urinary retention or gross hematuria. ?  ? ? ?Physical Exam  ? ?Triage Vital Signs: ?ED Triage Vitals  ?Enc Vitals Group  ?   BP 06/17/21 1519 (!) 166/105  ?   Pulse Rate 06/17/21 1519 76  ?   Resp 06/17/21 1519 20  ?   Temp 06/17/21 1519 98.4 ?F (36.9 ?C)  ?   Temp Source 06/17/21 1519 Oral  ?   SpO2 06/17/21 1519 96 %  ?   Weight 06/17/21 1519 280 lb (127 kg)  ?   Height 06/17/21 1519 5\' 4"  (1.626 m)  ?   Head Circumference --   ?   Peak Flow --   ?   Pain Score 06/17/21 1518 6  ?   Pain Loc --   ?   Pain Edu? --   ?   Excl. in GC? --   ? ? ?Most recent vital signs: ?Vitals:  ? 06/17/21 1519 06/17/21 1737  ?BP: (!) 166/105 (!) 161/93  ?Pulse: 76 72  ?Resp: 20 (!) 23  ?Temp: 98.4 ?F (36.9 ?C) 98.6 ?F (37 ?C)  ?SpO2: 96% 96%  ? ? ?General Awake, no distress. Uncomfortable, crying ?CV:  Good peripheral perfusion. RRR ?RESP:  Normal effort. CTA ?ABD:  No distention. Soft, nontender ? ? ?ED Results / Procedures / Treatments  ? ?Labs ?(all labs ordered are listed, but only abnormal results are displayed) ?Labs Reviewed  ?URINALYSIS, ROUTINE W REFLEX MICROSCOPIC - Abnormal; Notable for the following components:  ?    Result Value  ? Color, Urine AMBER (*)   ? APPearance CLOUDY (*)   ? Hgb urine dipstick LARGE (*)   ? Protein, ur 100 (*)   ? Leukocytes,Ua SMALL (*)   ? RBC / HPF >50 (*)   ? Bacteria, UA FEW (*)   ? All other components within normal limits  ?BASIC METABOLIC PANEL - Abnormal; Notable for the following  components:  ? Potassium 3.3 (*)   ? CO2 21 (*)   ? Calcium 8.8 (*)   ? All other components within normal limits  ?CBC - Abnormal; Notable for the following components:  ? RBC 5.19 (*)   ? All other components within normal limits  ?URINE CULTURE  ?PREGNANCY, URINE  ? ? ? ?EKG ? ? ? ?RADIOLOGY ? ?I personally viewed and evaluated these images as part of my medical decision making, as well as reviewing the written report by the radiologist. ? ?ED Provider Interpretation: ureteral calculi noted} ? ?CT Renal Stone Study ? ?Result Date: 06/17/2021 ?CLINICAL DATA:  Acute onset left flank pain 1 hour ago. Nausea and vomiting. Nephrolithiasis. EXAM: CT ABDOMEN AND PELVIS WITHOUT CONTRAST TECHNIQUE: Multidetector CT imaging of the abdomen and pelvis was performed following the standard protocol without IV contrast. RADIATION DOSE REDUCTION: This exam was performed according to the departmental dose-optimization program which includes automated exposure control, adjustment of the mA and/or kV according to patient size and/or  use of iterative reconstruction technique. COMPARISON:  03/12/2021 FINDINGS: Lower chest: No acute findings. Hepatobiliary: No mass visualized on this unenhanced exam. Prior cholecystectomy. No evidence of biliary obstruction. Pancreas: No mass or inflammatory process visualized on this unenhanced exam. Spleen:  Within normal limits in size. Adrenals/Urinary tract: Mild left hydronephrosis is seen due to a 2 mm calculus in the proximal left ureter. 2 mm nonobstructing right renal calculus noted. Stomach/Bowel: No evidence of obstruction, inflammatory process, or abnormal fluid collections. Vascular/Lymphatic: No pathologically enlarged lymph nodes identified. No evidence of abdominal aortic aneurysm. Reproductive:  No mass or other significant abnormality. Other:  None. Musculoskeletal:  No suspicious bone lesions identified. IMPRESSION: Mild left hydronephrosis due to 2 mm proximal left ureteral  calculus. Tiny nonobstructing right renal calculus. Electronically Signed   By: Danae OrleansJohn A Stahl M.D.   On: 06/17/2021 18:24   ? ? ?PROCEDURES: ? ?Critical Care performed: No ? ?Procedures ? ? ?MEDICATIONS ORDERED IN ED: ?Medications  ?tamsulosin (FLOMAX) capsule 0.4 mg (has no administration in time range)  ?sodium chloride 0.9 % bolus 1,000 mL (1,000 mLs Intravenous New Bag/Given 06/17/21 1840)  ?ondansetron (ZOFRAN) injection 4 mg (4 mg Intravenous Given 06/17/21 1754)  ?morphine (PF) 4 MG/ML injection 4 mg (4 mg Intravenous Given 06/17/21 1756)  ?cefTRIAXone (ROCEPHIN) 1 g in sodium chloride 0.9 % 100 mL IVPB (1 g Intravenous New Bag/Given 06/17/21 1850)  ?ketorolac (TORADOL) 30 MG/ML injection 15 mg (15 mg Intravenous Given 06/17/21 1846)  ?metoCLOPramide (REGLAN) injection 10 mg (10 mg Intravenous Given 06/17/21 1840)  ? ? ? ?IMPRESSION / MDM / ASSESSMENT AND PLAN / ED COURSE  ?I reviewed the triage vital signs and the nursing notes. ?             ?               ? ?Differential diagnosis includes, but is not limited to, Differential diagnosis includes, but is not limited to, ovarian cyst, ovarian torsion, acute appendicitis, diverticulitis, urinary tract infection/pyelonephritis, endometriosis, bowel obstruction, colitis, renal colic, gastroenteritis, hernia, fibroids, endometriosis, pregnancy related pain including ectopic pregnancy, etc. ? ? ?Patient to the ED for evaluation of acute flank pain with associated nausea vomiting.  Patient with history of kidney stones presents to the ED for evaluation.  Exam is reassuring though it does show a very uncomfortable patient with intermittent nausea and vomiting.  She is evaluated for complaints in the ED and has blood work that is overall reassuring.  No acute leukocytosis or critical anemia on CBC, and no abnormal kidney function or electrolyte abnormalities noted on CMP.  UA does reveal bacteriuria and hematuria.  Patient CT renal study does confirm a 2 mm left ureteral stone  proximally, and nonobstructing right renal stones.  Patient with noted improvement after fluid bolus and IV pain medicines are administered.  Patient's diagnosis is consistent with UTI and kidney stones.  Patient with a stable course in the ED without intractable nausea, pain, or urinary retention is stable for discharge, after consideration for admission.  Patient will be discharged home with prescriptions for oxycodone, tamsulosin, Zofran, and Keflex. Patient is to follow up with urology or her PCP as needed or otherwise directed. Patient is given ED precautions to return to the ED for any worsening or new symptoms. ? ?FINAL CLINICAL IMPRESSION(S) / ED DIAGNOSES  ? ?Final diagnoses:  ?Kidney stone  ?Lower urinary tract infectious disease  ? ? ? ?Rx / DC Orders  ? ?ED Discharge Orders   ? ?  Ordered  ?  cephALEXin (KEFLEX) 500 MG capsule  3 times daily       ? 06/17/21 2007  ?  ondansetron (ZOFRAN-ODT) 4 MG disintegrating tablet  Every 8 hours PRN       ? 06/17/21 2007  ?  tamsulosin (FLOMAX) 0.4 MG CAPS capsule  Daily after supper       ? 06/17/21 2007  ?  oxyCODONE (ROXICODONE) 5 MG immediate release tablet  Every 8 hours PRN       ? 06/17/21 2007  ? ?  ?  ? ?  ? ? ? ?Note:  This document was prepared using Dragon voice recognition software and may include unintentional dictation errors. ? ?  ?Jadis Pitter, Charlesetta Ivory, PA-C ?06/17/21 2011 ? ?  ?Willy Eddy, MD ?06/18/21 1118 ? ?

## 2021-06-17 NOTE — ED Notes (Signed)
Patient had a medium bile-colored emesis. Provider aware. ?

## 2021-06-17 NOTE — ED Triage Notes (Signed)
Pt reports sudden onset of left flank pain about an hour ago. Pt reports some NV as well. Pt reports hx kidney stones and states that it feels similar. ?

## 2021-06-19 LAB — URINE CULTURE

## 2021-06-22 ENCOUNTER — Encounter: Payer: Self-pay | Admitting: Family Medicine

## 2021-08-17 ENCOUNTER — Encounter: Payer: Self-pay | Admitting: Family Medicine

## 2021-08-17 ENCOUNTER — Telehealth (INDEPENDENT_AMBULATORY_CARE_PROVIDER_SITE_OTHER): Payer: BC Managed Care – PPO | Admitting: Family Medicine

## 2021-08-17 DIAGNOSIS — F331 Major depressive disorder, recurrent, moderate: Secondary | ICD-10-CM

## 2021-08-17 DIAGNOSIS — F419 Anxiety disorder, unspecified: Secondary | ICD-10-CM

## 2021-08-17 DIAGNOSIS — F411 Generalized anxiety disorder: Secondary | ICD-10-CM

## 2021-08-17 DIAGNOSIS — F410 Panic disorder [episodic paroxysmal anxiety] without agoraphobia: Secondary | ICD-10-CM | POA: Diagnosis not present

## 2021-08-17 MED ORDER — BUSPIRONE HCL 10 MG PO TABS
5.0000 mg | ORAL_TABLET | Freq: Three times a day (TID) | ORAL | 2 refills | Status: DC | PRN
Start: 2021-08-17 — End: 2022-03-15

## 2021-08-17 NOTE — Progress Notes (Addendum)
Subjective:    Patient ID: Melissa Nunez, female    DOB: Aug 18, 1985, 36 y.o.   MRN: [id number]  Melissa Nunez is a 36 y.o. female presenting on 08/17/2021 for Depression and Anxiety  Virtual / Telehealth Encounter - Video Visit via MyChart The purpose of this virtual visit is to provide medical care while limiting exposure to the novel coronavirus (COVID19) for both patient and office staff.  Consent was obtained for remote visit:  Yes.   Answered questions that patient had about telehealth interaction:  Yes.   I discussed the limitations, risks, security and privacy concerns of performing an evaluation and management service by video/telephone. I also discussed with the patient that there may be a patient responsible charge related to this service. The patient expressed understanding and agreed to proceed.  Patient Location: Home Provider Location: [facility] (Office)  Participants in virtual visit: - Patient: Melissa Nunez - CMA: Burnell Blanks, CMA - Provider: Dr [name]   HPI  Depression, recurrent moderate Anxiety with panic  Chronic problem with recurrent major depression and comorbid anxiety. 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  She has been managed on her SSRI Sertraline - dose has increase in the past 50mg  up to 100mg .  Reports new onset symptoms of panic attacks, prior to work with feeling of chest discomfort, difficulty breathing, heart racing, feeling panicked, eventually improved 30 min with rest. Seems to have similar episodes of panic.  She attributes her stress to work and the long drive to get to work. 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 Primary issue is difficulty with drive to work causing anxiety and stress and panic attacks and she is unable to drive during panic attack, and also difficulty focusing on tasks at work, if having panic symptoms can be tremoring or  shaking and difficulty concentrating on her work and completing tasks. She admits her emotional lability makes it difficult to handle emotionally charged or stressful situations at work.  following with therapist for the past 2 weeks, therapist, 2 visits so far virtually, her job offers 5 free counseling sessions, she has not benefit much from those sessions        08/17/2021   11:47 AM 05/30/2021    8:42 AM 03/21/2021    4:12 PM  Depression screen PHQ 2/9  Decreased Interest 3 1 2   Down, Depressed, Hopeless 3 1 3   PHQ - 2 Score 6 2 5   Altered sleeping 3 1 3   Tired, decreased energy 3 2 3   Change in appetite 2 2 2   Feeling bad or failure about yourself  3 0 2  Trouble concentrating 1 0 1  Moving slowly or fidgety/restless 1 0 0  Suicidal thoughts 0 0 0  PHQ-9 Score 19 7 16   Difficult doing work/chores Very difficult Not difficult at all Somewhat difficult      08/17/2021   11:54 AM 05/30/2021    8:43 AM 03/21/2021    4:13 PM 06/28/2020    1:29 PM  GAD 7 : Generalized Anxiety Score  Nervous, Anxious, on Edge 3 1 1 1   Control/stop worrying 3 0 1 1  Worry too much - different things 3 0 0 0  Trouble relaxing 3 1 3 3   Restless 3 0 0 0  Easily annoyed or irritable 3 0 1 1  Afraid - awful might happen 3 0 0 0  Total GAD 7 Score 21 2 6 6   Anxiety Difficulty Very difficult Not difficult at all Not difficult at all Not difficult at all     Social History   Tobacco Use   Smoking status: Never   Smokeless tobacco: Never  Vaping Use   Vaping Use: Never used  Substance Use Topics   Alcohol use: Yes    Alcohol/week: 1.0 standard drink of alcohol    Types: 1 Cans of beer per week   Drug use: No    Review of Systems Per HPI unless specifically indicated above     Objective:    There were no vitals taken for this visit.  Wt Readings from Last 3 Encounters:  06/17/21 280 lb (127 kg)  05/30/21 293 lb (132.9 kg)  03/21/21 250 lb (113.4 kg)    Physical  Exam  Note examination was completely remotely via video observation objective data only  Gen - well-appearing, no acute distress or apparent pain, comfortable HEENT - eyes appear clear without discharge or redness Heart/Lungs - cannot examine virtually - observed no evidence of coughing or labored breathing. Abd - cannot examine virtually  Skin - face visible today- no rash Neuro - awake, alert, oriented Psych - appears anxious, slightly tearful, mood is down, emotional lability   Results for orders placed or performed during the hospital encounter of 06/17/21  Urine Culture   Specimen: Urine, Clean Catch  Result Value Ref Range   Specimen Description      URINE, CLEAN CATCH Performed at [facility], [address] [place], [place]    Special Requests      NONE Performed at [facility], [address] [place], [place]    Culture MULTIPLE SPECIES PRESENT, SUGGEST RECOLLECTION (A)    Report Status 06/19/2021 FINAL   Urinalysis, Routine w reflex microscopic Urine, Clean Catch  Result Value Ref Range   Color, Urine AMBER (A) YELLOW   APPearance CLOUDY (A) CLEAR   Specific Gravity, Urine 1.025 1.005 - 1.030   pH 5.0 5.0 - 8.0   Glucose, UA NEGATIVE NEGATIVE mg/dL   Hgb urine dipstick LARGE (A) NEGATIVE   Bilirubin Urine NEGATIVE NEGATIVE   Ketones, ur NEGATIVE NEGATIVE mg/dL   Protein, ur 08/19/2021 (A) NEGATIVE mg/dL   Nitrite NEGATIVE NEGATIVE   Leukocytes,Ua SMALL (A) NEGATIVE   RBC / HPF >50 (H) 0 - 5 RBC/hpf   WBC, UA 6-10 0 - 5 WBC/hpf   Bacteria, UA FEW (A) NONE SEEN   Squamous Epithelial / LPF 0-5 0 - 5   Mucus PRESENT   Basic metabolic panel  Result Value Ref Range   Sodium 136 135 - 145 mmol/L   Potassium 3.3 (L) 3.5 - 5.1 mmol/L   Chloride 107 98 - 111 mmol/L   CO2 21 (L) 22 - 32 mmol/L   Glucose, Bld 97 70 - 99 mg/dL   BUN 10 6 - 20 mg/dL   Creatinine, Ser 115 0.44 - 1.00 mg/dL   Calcium 8.8 (L) 8.9 - 10.3 mg/dL    GFR, Estimated 7.26 >20 mL/min   Anion gap 8 5 - 15  CBC  Result Value Ref Range   WBC 9.4 4.0 - 10.5 K/uL   RBC 5.19 (H) 3.87 - 5.11 MIL/uL   Hemoglobin 14.9 12.0 - 15.0 g/dL   HCT >35 59.7 - 41.6 %   MCV 85.7 80.0 - 100.0 fL   MCH 28.7 26.0 - 34.0 pg   MCHC  33.5 30.0 - 36.0 g/dL   RDW 42.3 53.6 - 14.4 %   Platelets 248 150 - 400 K/uL   nRBC 0.0 0.0 - 0.2 %  Pregnancy, urine  Result Value Ref Range   Preg Test, Ur NEGATIVE NEGATIVE      Assessment & Plan:   Problem List Items Addressed This Visit     Recurrent moderate major depressive disorder with anxiety (HCC) - Primary   Relevant Medications   busPIRone (BUSPAR) 10 MG tablet   sertraline (ZOLOFT) 100 MG tablet   Generalized anxiety disorder with panic attacks   Relevant Medications   busPIRone (BUSPAR) 10 MG tablet   sertraline (ZOLOFT) 100 MG tablet    Major depression, recurrent moderate Comorbid GAD with panic attacks worsening  Increasing work and life stressors impacting her function now No longer controlled on SSRI therapy  Panic attacks and her worsening mood lability and functional symptoms with inability to focus impacting her ability to work.  Treatment plan  Increase dose Sertraline from 100 to 150mg  daily, take 1.5 tabs daily, future consider dose inc from 100 to 200mg  if indicated. No new refill yet can order in future.  Add Buspar 5-10mg  (10mg  tabs) TID PRN dosing for anxiety adjunct and can use PRN for panic attacks.  Recommend continue with therapist for now- handout AVS can consider other therapy options in future vs Psychiatry if indicated.  Agree with absence from work due to overwhelming mental health worsening condition that is impacting her function  Continuous FMLA 08/14/21 through 09/04/21 (3 weeks) Return to work on Tues 09/05/21  Intermittent FMLA Starting 09/05/21 for 6 months 4 episodes per month 2 days per episode  unable to drive due to panic attacks, 45 min commute  Unable to  focus on daily tasks with anxiety and tremors and panic / shaking difficulty typing, she works in HR, she has issue with emotional lability due to mood and anxiety, and is unable to provide emotional support to coworkers.  Will complete FMLA paperwork and return to patient / submit via fax as requested when she provides the paperwork.   Meds ordered this encounter  Medications   busPIRone (BUSPAR) 10 MG tablet    Sig: Take 0.5-1 tablets (5-10 mg total) by mouth 3 (three) times daily as needed (anxiety panic).    Dispense:  90 tablet    Refill:  2      Follow up plan: Return in about 4 weeks (around 09/14/2021) for 2-4 week follow up for depression anxiety FMLA update.  Patient verbalizes understanding with the above medical recommendations including the limitation of remote medical advice.  Specific follow-up and call-back criteria were given for patient to follow-up or seek medical care more urgently if needed.  Total duration of direct patient care provided via video conference: 15 minutes   09/07/21, DO [facility] 08/17/2021, 11:43 AM

## 2021-08-17 NOTE — Patient Instructions (Addendum)
Thank you for coming to the office today.  Continuous FMLA 08/15/22 through 09/04/21 Return to work on Tues 09/05/21  Intermittent FMLA 4 episodes per month 2 days per episode  [employer] message for paper work  [employer] add in for anxiety may take Buspar half or whole tab 5 to 10mg  2-3 times a day regularly at first for daily anxiety and may take extra dose for panic attack if need.  Inc dose of Sertraline from 100mg  daily to 150mg , you can take one and half tab, let me know if need more or if prefer to dose increase to 200mg .   These offices have both PSYCHIATRY doctors and THERAPISTS  [facility] (Virtual Available) [place] [address] [place], [place] Phone: [phone]  [facility] Address: [address] [place], [address] [phone] [email or web address] Phone: [phone]  [facility] - [facility]) Address: [address], [place], [facility] Hours: 8:30AM-5PM Phone: [phone]  [facility] (Adult, Peds, Geriatric, Counseling) [address] [place], [address] [phone] Phone: [phone] Fax: [phone]  [facility] at [facility] [address] [place], [facility] Phone: [phone]  [facility] (All ages) [address], [address] [place] [facility] [place], [place] Phone: [phone] (Option 1) [email or web address]  ----------------------------------------------------------------- THERAPIST ONLY  (No Psychiatry)  [facility] [address] [place], [phone] [place] P: [phone]  [facility]) [facility], [facility] [address] [place], [facility] [phone]  [facility]   Address: [address] [place], [phone] [facility] Hours: Open today   9AM-7PM Phone: [phone]  [facility] [address] [facility]  - [facility] Address: [address], [place], [address] [facility] Phone: [phone]   Please schedule a Follow-up Appointment to: Return in about 4 weeks (around 09/14/2021) for 2-4 week follow up for depression anxiety FMLA update.  If you have any other questions or concerns, please feel free to call the office or send a message through MyChart. You may also schedule an earlier appointment if necessary.  Additionally, you may be receiving a survey about your experience at our office within a few days to 1 week by e-mail or mail. We value your feedback.  [place], DO [facility], [phone]

## 2021-08-23 ENCOUNTER — Encounter: Payer: Self-pay | Admitting: Family Medicine

## 2021-09-05 ENCOUNTER — Encounter: Payer: Self-pay | Admitting: Family Medicine

## 2021-09-05 DIAGNOSIS — F331 Major depressive disorder, recurrent, moderate: Secondary | ICD-10-CM

## 2021-09-05 MED ORDER — SERTRALINE HCL 100 MG PO TABS: 150.0000 mg | ORAL_TABLET | Freq: Every day | ORAL | 3 refills | Status: DC

## 2021-09-11 ENCOUNTER — Other Ambulatory Visit: Payer: Self-pay | Admitting: Family Medicine

## 2021-09-12 NOTE — Telephone Encounter (Signed)
Requested Prescriptions  Pending Prescriptions Disp Refills  . SAXENDA 18 MG/3ML SOPN [Pharmacy Med Name: SAXENDA 18 MG/3 ML PEN] 15 mL 2    Sig: INJECTION 0.6 MG INTO SKIN ONCE DAILY FOR 1 WEEK, AS TOLERATED INCREASE BY INCREMENT OF 0.6MG  EVERY 1 WEEK, MAX DOSE IS 3MG  INJECTION DAILY AFTER 5 WEEKS.     Endocrinology:  Diabetes - GLP-1 Receptor Agonists Failed - 09/11/2021  8:17 AM      Failed - HBA1C is between 0 and 7.9 and within 180 days    No results found for: "HGBA1C", "LABA1C"       Passed - Valid encounter within last 6 months    Recent Outpatient Visits          3 weeks ago Recurrent moderate major depressive disorder with anxiety [facility])   [facility], [facility], DO   3 months ago Morbid obesity with BMI of 50.0-59.9, adult [facility])   [facility], [facility], DO   5 months ago Recurrent moderate major depressive disorder with anxiety [facility])   [facility], DO   1 year ago Recurrent moderate major depressive disorder with anxiety [facility])   [facility], DO   1 year ago Recurrent moderate major depressive disorder with anxiety [facility])   [facility] [place], [place], [place]

## 2021-11-23 ENCOUNTER — Telehealth: Payer: 59 | Admitting: Physician Assistant

## 2021-11-23 DIAGNOSIS — R051 Acute cough: Secondary | ICD-10-CM

## 2021-11-23 DIAGNOSIS — J019 Acute sinusitis, unspecified: Secondary | ICD-10-CM

## 2021-11-23 DIAGNOSIS — H6691 Otitis media, unspecified, right ear: Secondary | ICD-10-CM

## 2021-11-23 DIAGNOSIS — B9689 Other specified bacterial agents as the cause of diseases classified elsewhere: Secondary | ICD-10-CM

## 2021-11-23 MED ORDER — NEOMYCIN-POLYMYXIN-HC 3.5-10000-1 OT SOLN: 3.0000 [drp] | Freq: Four times a day (QID) | OTIC | 0 refills | Status: DC

## 2021-11-23 MED ORDER — PSEUDOEPH-BROMPHEN-DM 30-2-10 MG/5ML PO SYRP: 5.0000 mL | ORAL_SOLUTION | Freq: Four times a day (QID) | ORAL | 0 refills | Status: DC | PRN

## 2021-11-23 MED ORDER — AMOXICILLIN-POT CLAVULANATE 875-125 MG PO TABS: 1.0000 | ORAL_TABLET | Freq: Two times a day (BID) | ORAL | 0 refills | Status: DC

## 2021-11-23 MED ORDER — BENZONATATE 100 MG PO CAPS: 100.0000 mg | ORAL_CAPSULE | Freq: Three times a day (TID) | ORAL | 0 refills | Status: DC | PRN

## 2021-11-23 NOTE — Progress Notes (Signed)
Virtual Visit Consent   Melissa Nunez, you are scheduled for a virtual visit with a [facility] provider today. Just as with appointments in the office, your consent must be obtained to participate. Your consent will be active for this visit and any virtual visit you may have with one of our providers in the next 365 days. If you have a MyChart account, a copy of this consent can be sent to you electronically.  As this is a virtual visit, video technology does not allow for your provider to perform a traditional examination. This may limit your provider's ability to fully assess your condition. If your provider identifies any concerns that need to be evaluated in person or the need to arrange testing (such as labs, EKG, etc.), we will make arrangements to do so. Although advances in technology are sophisticated, we cannot ensure that it will always work on either your end or our end. If the connection with a video visit is poor, the visit may have to be switched to a telephone visit. With either a video or telephone visit, we are not always able to ensure that we have a secure connection.  By engaging in this virtual visit, you consent to the provision of healthcare and authorize for your insurance to be billed (if applicable) for the services provided during this visit. Depending on your insurance coverage, you may receive a charge related to this service.  I need to obtain your verbal consent now. Are you willing to proceed with your visit today? Melissa Nunez has provided verbal consent on 11/23/2021 for a virtual visit (video or telephone). Mar Daring, PA-C  Date: 11/23/2021 1:48 PM  Virtual Visit via Video Note   I, Mar Daring, connected with  Melissa Nunez  ([id number], 02/16/1985) on 11/23/21 at  1:45 PM EDT by a video-enabled telemedicine application and verified that I am speaking with the correct person using two identifiers.  Location: Patient: Virtual Visit  Location Patient: Home Provider: Virtual Visit Location Provider: Home Office   I discussed the limitations of evaluation and management by telemedicine and the availability of in person appointments. The patient expressed understanding and agreed to proceed.    History of Present Illness: Melissa Nunez is a 36 y.o. who identifies as a female who was assigned female at birth, and is being seen today for otalgia and congestion.   HPI: Otalgia  There is pain in the right ear. This is a new problem. The current episode started 1 to 4 weeks ago. The problem occurs constantly. The problem has been gradually worsening. Maximum temperature: subjective fevers earlier. The fever has been present for 1 to 2 days. The pain is moderate. Associated symptoms include coughing, diarrhea, headaches, hearing loss (tinnitus), rhinorrhea (post nasal drainage) and a sore throat (from coughing). Pertinent negatives include no ear discharge, neck pain or vomiting. Associated symptoms comments: dizziness. Treatments tried: mucinex, saline nasal spray, humidifier, ibuprofen. The treatment provided no relief. There is no history of a chronic ear infection, hearing loss or a tympanostomy tube.      Problems:  Patient Active Problem List   Diagnosis Date Noted   Generalized anxiety disorder with panic attacks 08/17/2021   Recurrent moderate major depressive disorder with anxiety ([place]) 05/31/2020   Morbid obesity with BMI of 45.0-49.9, adult ([place]) 05/31/2020    Allergies: No Known Allergies Medications:  Current Outpatient Medications:    amoxicillin-clavulanate (AUGMENTIN) 875-125 MG tablet, Take 1 tablet by mouth 2 (  two) times daily., Disp: 20 tablet, Rfl: 0   benzonatate (TESSALON) 100 MG capsule, Take 1 capsule (100 mg total) by mouth 3 (three) times daily as needed., Disp: 30 capsule, Rfl: 0   brompheniramine-pseudoephedrine-DM 30-2-10 MG/5ML syrup, Take 5 mLs by mouth 4 (four) times daily as needed., Disp: 120  mL, Rfl: 0   neomycin-polymyxin-hydrocortisone (CORTISPORIN) OTIC solution, Place 3 drops into the right ear 4 (four) times daily., Disp: 10 mL, Rfl: 0   busPIRone (BUSPAR) 10 MG tablet, Take 0.5-1 tablets (5-10 mg total) by mouth 3 (three) times daily as needed (anxiety panic)., Disp: 90 tablet, Rfl: 2   ondansetron (ZOFRAN-ODT) 4 MG disintegrating tablet, Take 1 tablet (4 mg total) by mouth every 8 (eight) hours as needed for nausea or vomiting., Disp: 15 tablet, Rfl: 0   SAXENDA 18 MG/3ML SOPN, INJECTION 0.6 MG INTO SKIN ONCE DAILY FOR 1 WEEK, AS TOLERATED INCREASE BY INCREMENT OF 0.6MG  EVERY 1 WEEK, MAX DOSE IS 3MG  INJECTION DAILY AFTER 5 WEEKS., Disp: 15 mL, Rfl: 2   sertraline (ZOLOFT) 100 MG tablet, Take 1.5 tablets (150 mg total) by mouth daily., Disp: 135 tablet, Rfl: 3  Observations/Objective: Patient is well-developed, well-nourished in no acute distress.  Resting comfortably at home.  Head is normocephalic, atraumatic.  No labored breathing.  Speech is clear and coherent with logical content.  Patient is alert and oriented at baseline.    Assessment and Plan: 1. Acute bacterial sinusitis - amoxicillin-clavulanate (AUGMENTIN) 875-125 MG tablet; Take 1 tablet by mouth 2 (two) times daily.  Dispense: 20 tablet; Refill: 0  2. Acute bacterial otitis media, right - amoxicillin-clavulanate (AUGMENTIN) 875-125 MG tablet; Take 1 tablet by mouth 2 (two) times daily.  Dispense: 20 tablet; Refill: 0 - neomycin-polymyxin-hydrocortisone (CORTISPORIN) OTIC solution; Place 3 drops into the right ear 4 (four) times daily.  Dispense: 10 mL; Refill: 0  3. Acute cough - brompheniramine-pseudoephedrine-DM 30-2-10 MG/5ML syrup; Take 5 mLs by mouth 4 (four) times daily as needed.  Dispense: 120 mL; Refill: 0 - benzonatate (TESSALON) 100 MG capsule; Take 1 capsule (100 mg total) by mouth 3 (three) times daily as needed.  Dispense: 30 capsule; Refill: 0  - Worsening symptoms that have not responded to  OTC medications.  - Will give Augmentin and Cortisporin ear drops - Bromfed DM and tessalon for cough - Continue flonase and saline nasal rinses  - Steam and humidifier can help - Stay well hydrated and get plenty of rest.  - Seek in person evaluation if no symptom improvement or if symptoms worsen   Follow Up Instructions: I discussed the assessment and treatment plan with the patient. The patient was provided an opportunity to ask questions and all were answered. The patient agreed with the plan and demonstrated an understanding of the instructions.  A copy of instructions were sent to the patient via MyChart unless otherwise noted below.    The patient was advised to call back or seek an in-person evaluation if the symptoms worsen or if the condition fails to improve as anticipated.  Time:  I spent 11 minutes with the patient via telehealth technology discussing the above problems/concerns.    Mar Daring, PA-C

## 2021-11-23 NOTE — Patient Instructions (Signed)
Lupita Dawn, thank you for joining Margaretann Loveless, PA-C for today's virtual visit.  While this provider is not your primary care provider (PCP), if your PCP is located in our provider database this encounter information will be shared with them immediately following your visit.  Consent: (Patient) Desree Leap provided verbal consent for this virtual visit at the beginning of the encounter.  Current Medications:  Current Outpatient Medications:    amoxicillin-clavulanate (AUGMENTIN) 875-125 MG tablet, Take 1 tablet by mouth 2 (two) times daily., Disp: 20 tablet, Rfl: 0   benzonatate (TESSALON) 100 MG capsule, Take 1 capsule (100 mg total) by mouth 3 (three) times daily as needed., Disp: 30 capsule, Rfl: 0   brompheniramine-pseudoephedrine-DM 30-2-10 MG/5ML syrup, Take 5 mLs by mouth 4 (four) times daily as needed., Disp: 120 mL, Rfl: 0   neomycin-polymyxin-hydrocortisone (CORTISPORIN) OTIC solution, Place 3 drops into the right ear 4 (four) times daily., Disp: 10 mL, Rfl: 0   busPIRone (BUSPAR) 10 MG tablet, Take 0.5-1 tablets (5-10 mg total) by mouth 3 (three) times daily as needed (anxiety panic)., Disp: 90 tablet, Rfl: 2   ondansetron (ZOFRAN-ODT) 4 MG disintegrating tablet, Take 1 tablet (4 mg total) by mouth every 8 (eight) hours as needed for nausea or vomiting., Disp: 15 tablet, Rfl: 0   SAXENDA 18 MG/3ML SOPN, INJECTION 0.6 MG INTO SKIN ONCE DAILY FOR 1 WEEK, AS TOLERATED INCREASE BY INCREMENT OF 0.6MG  EVERY 1 WEEK, MAX DOSE IS 3MG  INJECTION DAILY AFTER 5 WEEKS., Disp: 15 mL, Rfl: 2   sertraline (ZOLOFT) 100 MG tablet, Take 1.5 tablets (150 mg total) by mouth daily., Disp: 135 tablet, Rfl: 3   Medications ordered in this encounter:  Meds ordered this encounter  Medications   amoxicillin-clavulanate (AUGMENTIN) 875-125 MG tablet    Sig: Take 1 tablet by mouth 2 (two) times daily.    Dispense:  20 tablet    Refill:  0    Order Specific Question:   Supervising Provider     Answer:   Merrilee Jansky   neomycin-polymyxin-hydrocortisone (CORTISPORIN) OTIC solution    Sig: Place 3 drops into the right ear 4 (four) times daily.    Dispense:  10 mL    Refill:  0    Order Specific Question:   Supervising Provider    Answer:   [id number] Merrilee Jansky   brompheniramine-pseudoephedrine-DM 30-2-10 MG/5ML syrup    Sig: Take 5 mLs by mouth 4 (four) times daily as needed.    Dispense:  120 mL    Refill:  0    Order Specific Question:   Supervising Provider    Answer:   [id number] Merrilee Jansky   benzonatate (TESSALON) 100 MG capsule    Sig: Take 1 capsule (100 mg total) by mouth 3 (three) times daily as needed.    Dispense:  30 capsule    Refill:  0    Order Specific Question:   Supervising Provider    Answer:   [[id number]] Merrilee Jansky     *If you need refills on other medications prior to your next appointment, please contact your pharmacy*  Follow-Up: Call back or seek an in-person evaluation if the symptoms worsen or if the condition fails to improve as anticipated.  [facility] [phone]  Other Instructions  Otitis Media, Adult  Otitis media is a condition in which the middle ear is red and swollen (inflamed) and full of fluid. The middle ear is the  part of the ear that contains bones for hearing as well as air that helps send sounds to the brain. The condition usually goes away on its own. What are the causes? This condition is caused by a blockage in the eustachian tube. This tube connects the middle ear to the back of the nose. It normally allows air into the middle ear. The blockage is caused by fluid or swelling. Problems that can cause blockage include: A cold or infection that affects the nose, mouth, or throat. Allergies. An irritant, such as tobacco smoke. Adenoids that have become large. The adenoids are soft tissue located in the back of the throat, behind the nose and the roof of the mouth. Growth or  swelling in the upper part of the throat, just behind the nose (nasopharynx). Damage to the ear caused by a change in pressure. This is called barotrauma. What increases the risk? You are more likely to develop this condition if you: Smoke or are exposed to tobacco smoke. Have an opening in the roof of your mouth (cleft palate). Have acid reflux. Have problems in your body's defense system (immune system). What are the signs or symptoms? Symptoms of this condition include: Ear pain. Fever. Problems with hearing. Being tired. Fluid leaking from the ear. Ringing in the ear. How is this treated? This condition can go away on its own within 3-5 days. But if the condition is caused by germs (bacteria) and does not go away on its own, or if it keeps coming back, your doctor may: Give you antibiotic medicines. Give you medicines for pain. Follow these instructions at home: Take over-the-counter and prescription medicines only as told by your doctor. If you were prescribed an antibiotic medicine, take it as told by your doctor. Do not stop taking it even if you start to feel better. Keep all follow-up visits. Contact a doctor if: You have bleeding from your nose. There is a lump on your neck. You are not feeling better in 5 days. You feel worse instead of better. Get help right away if: You have pain that is not helped with medicine. You have swelling, redness, or pain around your ear. You get a stiff neck. You cannot move part of your face (paralysis). You notice that the bone behind your ear hurts when you touch it. You get a very bad headache. Summary Otitis media means that the middle ear is red, swollen, and full of fluid. This condition usually goes away on its own. If the problem does not go away, treatment may be needed. You may be given medicines to treat the infection or to treat your pain. If you were prescribed an antibiotic medicine, take it as told by your doctor. Do not  stop taking it even if you start to feel better. Keep all follow-up visits. This information is not intended to replace advice given to you by your health care provider. Make sure you discuss any questions you have with your health care provider. Document Revised: 05/09/2020 Document Reviewed: 05/09/2020 Elsevier Patient Education  2023 Else[employer] Inc.    Sinus Infection, Adult A sinus infection is soreness and swelling (inflammation) of your sinuses. Sinuses are hollow spaces in the bones around your face. They are located: Around your eyes. In the middle of your forehead. Behind your nose. In your cheekbones. Your sinuses and nasal passages are lined with a fluid called mucus. Mucus drains out of your sinuses. Swelling can trap mucus in your sinuses. This lets germs (bacteria,  virus, or fungus) grow, which leads to infection. Most of the time, this condition is caused by a virus. What are the causes? Allergies. Asthma. Germs. Things that block your nose or sinuses. Growths in the nose (nasal polyps). Chemicals or irritants in the air. A fungus. This is rare. What increases the risk? Having a weak body defense system (immune system). Doing a lot of swimming or diving. Using nasal sprays too much. Smoking. What are the signs or symptoms? The main symptoms of this condition are pain and a feeling of pressure around the sinuses. Other symptoms include: Stuffy nose (congestion). This may make it hard to breathe through your nose. Runny nose (drainage). Soreness, swelling, and warmth in the sinuses. A cough that may get worse at night. Being unable to smell and taste. Mucus that collects in the throat or the back of the nose (postnasal drip). This may cause a sore throat or bad breath. Being very tired (fatigued). A fever. How is this diagnosed? Your symptoms. Your medical history. A physical exam. Tests to find out if your condition is short-term (acute) or long-term (chronic).  Your doctor may: Check your nose for growths (polyps). Check your sinuses using a tool that has a light on one end (endoscope). Check for allergies or germs. Do imaging tests, such as an MRI or CT scan. How is this treated? Treatment for this condition depends on the cause and whether it is short-term or long-term. If caused by a virus, your symptoms should go away on their own within 10 days. You may be given medicines to relieve symptoms. They include: Medicines that shrink swollen tissue in the nose. A spray that treats swelling of the nostrils. Rinses that help get rid of thick mucus in your nose (nasal saline washes). Medicines that treat allergies (antihistamines). Over-the-counter pain relievers. If caused by bacteria, your doctor may wait to see if you will get better without treatment. You may be given antibiotic medicine if you have: A very bad infection. A weak body defense system. If caused by growths in the nose, surgery may be needed. Follow these instructions at home: Medicines Take, use, or apply over-the-counter and prescription medicines only as told by your doctor. These may include nasal sprays. If you were prescribed an antibiotic medicine, take it as told by your doctor. Do not stop taking it even if you start to feel better. Hydrate and humidify  Drink enough water to keep your pee (urine) pale yellow. Use a cool mist humidifier to keep the humidity level in your home above 50%. Breathe in steam for 10-15 minutes, 3-4 times a day, or as told by your doctor. You can do this in the bathroom while a hot shower is running. Try not to spend time in cool or dry air. Rest Rest as much as you can. Sleep with your head raised (elevated). Make sure you get enough sleep each night. General instructions  Put a warm, moist washcloth on your face 3-4 times a day, or as often as told by your doctor. Use nasal saline washes as often as told by your doctor. Wash your hands  often with soap and water. If you cannot use soap and water, use hand sanitizer. Do not smoke. Avoid being around people who are smoking (secondhand smoke). Keep all follow-up visits. Contact a doctor if: You have a fever. Your symptoms get worse. Your symptoms do not get better within 10 days. Get help right away if: You have a very bad headache.  You cannot stop vomiting. You have very bad pain or swelling around your face or eyes. You have trouble seeing. You feel confused. Your neck is stiff. You have trouble breathing. These symptoms may be an emergency. Get help right away. Call 911. Do not wait to see if the symptoms will go away. Do not drive yourself to the hospital. Summary A sinus infection is swelling of your sinuses. Sinuses are hollow spaces in the bones around your face. This condition is caused by tissues in your nose that become inflamed or swollen. This traps germs. These can lead to infection. If you were prescribed an antibiotic medicine, take it as told by your doctor. Do not stop taking it even if you start to feel better. Keep all follow-up visits. This information is not intended to replace advice given to you by your health care provider. Make sure you discuss any questions you have with your health care provider. Document Revised: 01/03/2021 Document Reviewed: 01/03/2021 Elsevier Patient Education  2023 Elsevier Inc.    If you have been instructed to have an in-person evaluation today at a local Urgent Care facility, please use the link below. It will take you to a list of all of our available [facility], including address, phone number and hours of operation. Please do not delay care.  [facility]  If you or a family member do not have a primary care provider, use the link below to schedule a visit and establish care. When you choose a [facility] primary care physician or advanced practice provider, you gain a long-term partner in  health. Find a Primary Care Provider  Learn more about [facility]'s in-office and virtual care options: [facility] - Get Care Now

## 2021-12-13 ENCOUNTER — Telehealth: Payer: Self-pay | Admitting: Physician Assistant

## 2021-12-13 DIAGNOSIS — J208 Acute bronchitis due to other specified organisms: Secondary | ICD-10-CM

## 2021-12-13 DIAGNOSIS — B9689 Other specified bacterial agents as the cause of diseases classified elsewhere: Secondary | ICD-10-CM

## 2021-12-13 MED ORDER — PROMETHAZINE-DM 6.25-15 MG/5ML PO SYRP: 5.0000 mL | ORAL_SOLUTION | Freq: Four times a day (QID) | ORAL | 0 refills | Status: DC | PRN

## 2021-12-13 MED ORDER — ALBUTEROL SULFATE HFA 108 (90 BASE) MCG/ACT IN AERS: 2.0000 | INHALATION_SPRAY | Freq: Four times a day (QID) | RESPIRATORY_TRACT | 0 refills | Status: DC | PRN

## 2021-12-13 MED ORDER — BENZONATATE 100 MG PO CAPS: 100.0000 mg | ORAL_CAPSULE | Freq: Three times a day (TID) | ORAL | 0 refills | Status: DC | PRN

## 2021-12-13 MED ORDER — DOXYCYCLINE HYCLATE 100 MG PO TABS: 100.0000 mg | ORAL_TABLET | Freq: Two times a day (BID) | ORAL | 0 refills | Status: DC

## 2021-12-13 NOTE — Progress Notes (Signed)
Virtual Visit Consent   Melissa Nunez, you are scheduled for a virtual visit with a [facility] provider today. Just as with appointments in the office, your consent must be obtained to participate. Your consent will be active for this visit and any virtual visit you may have with one of our providers in the next 365 days. If you have a MyChart account, a copy of this consent can be sent to you electronically.  As this is a virtual visit, video technology does not allow for your provider to perform a traditional examination. This may limit your provider's ability to fully assess your condition. If your provider identifies any concerns that need to be evaluated in person or the need to arrange testing (such as labs, EKG, etc.), we will make arrangements to do so. Although advances in technology are sophisticated, we cannot ensure that it will always work on either your end or our end. If the connection with a video visit is poor, the visit may have to be switched to a telephone visit. With either a video or telephone visit, we are not always able to ensure that we have a secure connection.  By engaging in this virtual visit, you consent to the provision of healthcare and authorize for your insurance to be billed (if applicable) for the services provided during this visit. Depending on your insurance coverage, you may receive a charge related to this service.  I need to obtain your verbal consent now. Are you willing to proceed with your visit today? Melissa Nunez has provided verbal consent on 12/13/2021 for a virtual visit (video or telephone). Melissa Nunez, [place]  Date: 12/13/2021 8:36 AM  Virtual Visit via Video Note   I, Melissa Nunez, connected with  Emberli Ballester  ([id number], [birth year]) on 12/13/21 at  8:30 AM EDT by a video-enabled telemedicine application and verified that I am speaking with the correct person using two identifiers.  Location: Patient: Virtual Visit  Location Patient: Home Provider: Virtual Visit Location Provider: Home Office   I discussed the limitations of evaluation and management by telemedicine and the availability of in person appointments. The patient expressed understanding and agreed to proceed.    History of Present Illness: Melissa Nunez is a 36 y.o. who identifies as a female who was assigned female at birth, and is being seen today for some recurrent chest congestion and cough with some green-brown sputum after treatment for sinusitis last month with 10-day course of Augmentin. Notes was feeling much better but symptoms not fully resolved at completion of antibiotic. Symptoms slowly building back up since that time. Denies fever, chills. Denies chest pain or SOB. Some fatigue noted. Is taking Mucinex OTC. Marland Kitchen  HPI: HPI  Problems:  Patient Active Problem List   Diagnosis Date Noted   Generalized anxiety disorder with panic attacks 08/17/2021   Recurrent moderate major depressive disorder with anxiety (HCC) 05/31/2020   Morbid obesity with BMI of 45.0-49.9, adult (HCC) 05/31/2020    Allergies: No Known Allergies Medications:  Current Outpatient Medications:    albuterol (VENTOLIN HFA) 108 (90 Base) MCG/ACT inhaler, Inhale 2 puffs into the lungs every 6 (six) hours as needed for wheezing or shortness of breath., Disp: 8 g, Rfl: 0   benzonatate (TESSALON) 100 MG capsule, Take 1 capsule (100 mg total) by mouth 3 (three) times daily as needed for cough., Disp: 30 capsule, Rfl: 0   doxycycline (VIBRA-TABS) 100 MG tablet, Take 1 tablet (100 mg total)  by mouth 2 (two) times daily., Disp: 14 tablet, Rfl: 0   promethazine-dextromethorphan (PROMETHAZINE-DM) 6.25-15 MG/5ML syrup, Take 5 mLs by mouth 4 (four) times daily as needed for cough., Disp: 118 mL, Rfl: 0   busPIRone (BUSPAR) 10 MG tablet, Take 0.5-1 tablets (5-10 mg total) by mouth 3 (three) times daily as needed (anxiety panic)., Disp: 90 tablet, Rfl: 2   SAXENDA 18 MG/3ML  SOPN, INJECTION 0.6 MG INTO SKIN ONCE DAILY FOR 1 WEEK, AS TOLERATED INCREASE BY INCREMENT OF 0.6MG  EVERY 1 WEEK, MAX DOSE IS 3MG  INJECTION DAILY AFTER 5 WEEKS., Disp: 15 mL, Rfl: 2   sertraline (ZOLOFT) 100 MG tablet, Take 1.5 tablets (150 mg total) by mouth daily., Disp: 135 tablet, Rfl: 3  Observations/Objective: Patient is well-developed, well-nourished in no acute distress.  Resting comfortably at home.  Head is normocephalic, atraumatic.  No labored breathing. Speech is clear and coherent with logical content.  Patient is alert and oriented at baseline.   Assessment and Plan: 1. Acute bacterial bronchitis - doxycycline (VIBRA-TABS) 100 MG tablet; Take 1 tablet (100 mg total) by mouth 2 (two) times daily.  Dispense: 14 tablet; Refill: 0 - benzonatate (TESSALON) 100 MG capsule; Take 1 capsule (100 mg total) by mouth 3 (three) times daily as needed for cough.  Dispense: 30 capsule; Refill: 0 - promethazine-dextromethorphan (PROMETHAZINE-DM) 6.25-15 MG/5ML syrup; Take 5 mLs by mouth 4 (four) times daily as needed for cough.  Dispense: 118 mL; Refill: 0 - albuterol (VENTOLIN HFA) 108 (90 Base) MCG/ACT inhaler; Inhale 2 puffs into the lungs every 6 (six) hours as needed for wheezing or shortness of breath.  Dispense: 8 g; Refill: 0  Rx Doxycycline.  Increase fluids.  Rest.  Saline nasal spray.  Probiotic.  Mucinex as directed.  Humidifier in bedroom. Tessalon, promethazine-dm and albuterol per orders.  Call or return to clinic if symptoms are not improving.   Follow Up Instructions: I discussed the assessment and treatment plan with the patient. The patient was provided an opportunity to ask questions and all were answered. The patient agreed with the plan and demonstrated an understanding of the instructions.  A copy of instructions were sent to the patient via MyChart unless otherwise noted below.    The patient was advised to call back or seek an in-person evaluation if the symptoms  worsen or if the condition fails to improve as anticipated.  Time:  I spent 10 minutes with the patient via telehealth technology discussing the above problems/concerns.    Leeanne Rio, PA-C

## 2021-12-13 NOTE — Patient Instructions (Signed)
Paris Lore, thank you for joining Leeanne Rio, PA-C for today's virtual visit.  While this provider is not your primary care provider (PCP), if your PCP is located in our provider database this encounter information will be shared with them immediately following your visit.   [facility] account gives you access to today's visit and all your visits, tests, and labs performed at [facility] " click here if you don't have a [facility] account or go to [email or web address]  Consent: (Patient) Melissa Nunez provided verbal consent for this virtual visit at the beginning of the encounter.  Current Medications:  Current Outpatient Medications:    amoxicillin-clavulanate (AUGMENTIN) 875-125 MG tablet, Take 1 tablet by mouth 2 (two) times daily., Disp: 20 tablet, Rfl: 0   benzonatate (TESSALON) 100 MG capsule, Take 1 capsule (100 mg total) by mouth 3 (three) times daily as needed., Disp: 30 capsule, Rfl: 0   brompheniramine-pseudoephedrine-DM 30-2-10 MG/5ML syrup, Take 5 mLs by mouth 4 (four) times daily as needed., Disp: 120 mL, Rfl: 0   busPIRone (BUSPAR) 10 MG tablet, Take 0.5-1 tablets (5-10 mg total) by mouth 3 (three) times daily as needed (anxiety panic)., Disp: 90 tablet, Rfl: 2   neomycin-polymyxin-hydrocortisone (CORTISPORIN) OTIC solution, Place 3 drops into the right ear 4 (four) times daily., Disp: 10 mL, Rfl: 0   ondansetron (ZOFRAN-ODT) 4 MG disintegrating tablet, Take 1 tablet (4 mg total) by mouth every 8 (eight) hours as needed for nausea or vomiting., Disp: 15 tablet, Rfl: 0   SAXENDA 18 MG/3ML SOPN, INJECTION 0.6 MG INTO SKIN ONCE DAILY FOR 1 WEEK, AS TOLERATED INCREASE BY INCREMENT OF 0.6MG  EVERY 1 WEEK, MAX DOSE IS 3MG  INJECTION DAILY AFTER 5 WEEKS., Disp: 15 mL, Rfl: 2   sertraline (ZOLOFT) 100 MG tablet, Take 1.5 tablets (150 mg total) by mouth daily., Disp: 135 tablet, Rfl: 3   Medications ordered in this encounter:  No  orders of the defined types were placed in this encounter.    *If you need refills on other medications prior to your next appointment, please contact your pharmacy*  Follow-Up: Call back or seek an in-person evaluation if the symptoms worsen or if the condition fails to improve as anticipated.  [facility] [phone]  Other Instructions Take antibiotic (Doxycycline) as directed.  Increase fluids.  Get plenty of rest. Use Mucinex for congestion. Use the cough medications and albuterol as directed. Take a daily probiotic (I recommend Align or Culturelle, but even Activia Yogurt may be beneficial).  A humidifier placed in the bedroom may offer some relief for a dry, scratchy throat of nasal irritation.  Read information below on acute bronchitis. Please call or return to clinic if symptoms are not improving.  Acute Bronchitis Bronchitis is when the airways that extend from the windpipe into the lungs get red, puffy, and painful (inflamed). Bronchitis often causes thick spit (mucus) to develop. This leads to a cough. A cough is the most common symptom of bronchitis. In acute bronchitis, the condition usually begins suddenly and goes away over time (usually in 2 weeks). Smoking, allergies, and asthma can make bronchitis worse. Repeated episodes of bronchitis may cause more lung problems.  HOME CARE Rest. Drink enough fluids to keep your pee (urine) clear or pale yellow (unless you need to limit fluids as told by your doctor). Only take over-the-counter or prescription medicines as told by your doctor. Avoid smoking and secondhand smoke. These can make bronchitis worse.  If you are a smoker, think about using nicotine gum or skin patches. Quitting smoking will help your lungs heal faster. Reduce the chance of getting bronchitis again by: Washing your hands often. Avoiding people with cold symptoms. Trying not to touch your hands to your mouth, nose, or eyes. Follow up with your  doctor as told.  GET HELP IF: Your symptoms do not improve after 1 week of treatment. Symptoms include: Cough. Fever. Coughing up thick spit. Body aches. Chest congestion. Chills. Shortness of breath. Sore throat.  GET HELP RIGHT AWAY IF:  You have an increased fever. You have chills. You have severe shortness of breath. You have bloody thick spit (sputum). You throw up (vomit) often. You lose too much body fluid (dehydration). You have a severe headache. You faint.  MAKE SURE YOU:  Understand these instructions. Will watch your condition. Will get help right away if you are not doing well or get worse. Document Released: 07/18/2007 Document Revised: 10/01/2012 Document Reviewed: 07/22/2012 [facility] Patient Information 2015 [place], [place]. This information is not intended to replace advice given to you by your health care provider. Make sure you discuss any questions you have with your health care provider.    If you have been instructed to have an in-person evaluation today at a local Urgent Care facility, please use the link below. It will take you to a list of all of our available [facility], including address, phone number and hours of operation. Please do not delay care.  [facility]  If you or a family member do not have a primary care provider, use the link below to schedule a visit and establish care. When you choose a [facility] primary care physician or advanced practice provider, you gain a long-term partner in health. Find a Primary Care Provider  Learn more about [facility]'s in-office and virtual care options: [facility] - Get Care Now

## 2022-02-20 ENCOUNTER — Ambulatory Visit: Payer: Self-pay | Admitting: Family Medicine

## 2022-03-15 ENCOUNTER — Encounter: Payer: Self-pay | Admitting: Family Medicine

## 2022-03-15 ENCOUNTER — Other Ambulatory Visit: Payer: Self-pay | Admitting: Family Medicine

## 2022-03-15 ENCOUNTER — Ambulatory Visit: Payer: Self-pay | Admitting: Family Medicine

## 2022-03-15 VITALS — BP 122/84 | HR 75 | Ht 64.0 in | Wt 290.0 lb

## 2022-03-15 DIAGNOSIS — Z6841 Body Mass Index (BMI) 40.0 and over, adult: Secondary | ICD-10-CM

## 2022-03-15 DIAGNOSIS — Z131 Encounter for screening for diabetes mellitus: Secondary | ICD-10-CM

## 2022-03-15 DIAGNOSIS — Z13220 Encounter for screening for lipoid disorders: Secondary | ICD-10-CM

## 2022-03-15 DIAGNOSIS — F3341 Major depressive disorder, recurrent, in partial remission: Secondary | ICD-10-CM

## 2022-03-15 DIAGNOSIS — F410 Panic disorder [episodic paroxysmal anxiety] without agoraphobia: Secondary | ICD-10-CM

## 2022-03-15 DIAGNOSIS — F411 Generalized anxiety disorder: Secondary | ICD-10-CM

## 2022-03-15 DIAGNOSIS — Z0000 Encounter for general adult medical examination without abnormal findings: Secondary | ICD-10-CM

## 2022-03-15 MED ORDER — CONTRAVE 8-90 MG PO TB12: ORAL_TABLET | ORAL | 0 refills | Status: DC

## 2022-03-15 NOTE — Progress Notes (Signed)
Subjective:    Patient ID: Melissa Nunez, female    DOB: 1985/10/09, 37 y.o.   MRN: [id number]  Arleen Bar is a 38 y.o. female presenting on 03/15/2022 for Obesity   HPI  Depression, recurrent in partial remission Anxiety with panic  last visit 08/2021, she has improved overall. Off Buspar. Continues Sertraline 150mg  daily (100mg  tab takes 1.5) Currently doing well  Morbid Obesity BMI >49 Previously managed with lifestyle diet interventions. Also has tried [place] off of this med. Interested in other medication options.     03/15/2022    8:54 AM 08/17/2021   11:47 AM 05/30/2021    8:42 AM  Depression screen PHQ 2/9  Decreased Interest 1 3 1   Down, Depressed, Hopeless 1 3 1   PHQ - 2 Score 2 6 2   Altered sleeping 2 3 1   Tired, decreased energy 1 3 2   Change in appetite 2 2 2   Feeling bad or failure about yourself  1 3 0  Trouble concentrating 1 1 0  Moving slowly or fidgety/restless 0 1 0  Suicidal thoughts 0 0 0  PHQ-9 Score 9 19 7   Difficult doing work/chores Not difficult at all Very difficult Not difficult at all    Social History   Tobacco Use   Smoking status: Never   Smokeless tobacco: Never  Vaping Use   Vaping Use: Never used  Substance Use Topics   Alcohol use: Yes    Alcohol/week: 1.0 standard drink of alcohol    Types: 1 Cans of beer per week   Drug use: No    Review of Systems Per HPI unless specifically indicated above     Objective:    BP 122/84   Pulse 75   Ht 5\' 4"  (1.626 m)   Wt 290 lb (131.5 kg)   SpO2 98%   BMI 49.78 kg/m   Wt Readings from Last 3 Encounters:  03/15/22 290 lb (131.5 kg)  06/17/21 280 lb (127 kg)  05/30/21 293 lb (132.9 kg)    Physical Exam Vitals and nursing note reviewed.  Constitutional:      General: She is not in acute distress.    Appearance: She is well-developed. She is obese. She is not diaphoretic.     Comments: Well-appearing, comfortable, cooperative  HENT:     Head: Normocephalic and  atraumatic.  Eyes:     General:        Right eye: No discharge.        Left eye: No discharge.     Conjunctiva/sclera: Conjunctivae normal.  Neck:     Thyroid: No thyromegaly.  Cardiovascular:     Rate and Rhythm: Normal rate and regular rhythm.     Heart sounds: Normal heart sounds. No murmur heard. Pulmonary:     Effort: Pulmonary effort is normal. No respiratory distress.     Breath sounds: Normal breath sounds. No wheezing or rales.  Musculoskeletal:        General: Normal range of motion.     Cervical back: Normal range of motion and neck supple.  Lymphadenopathy:     Cervical: No cervical adenopathy.  Skin:    General: Skin is warm and dry.     Findings: No erythema or rash.  Neurological:     Mental Status: She is alert and oriented to person, place, and time.  Psychiatric:        Behavior: Behavior normal.     Comments: Well groomed, good eye contact, normal speech  and thoughts      Results for orders placed or performed during the hospital encounter of 06/17/21  Urine Culture   Specimen: Urine, Clean Catch  Result Value Ref Range   Specimen Description      URINE, CLEAN CATCH Performed at [facility], [address] [place], [place] [address]    Special Requests      NONE Performed at [facility], [address] [place], [place] [address]    Culture MULTIPLE SPECIES PRESENT, SUGGEST RECOLLECTION (A)    Report Status 06/19/2021 FINAL   Urinalysis, Routine w reflex microscopic Urine, Clean Catch  Result Value Ref Range   Color, Urine AMBER (A) YELLOW   APPearance CLOUDY (A) CLEAR   Specific Gravity, Urine 1.025 1.005 - 1.030   pH 5.0 5.0 - 8.0   Glucose, UA NEGATIVE NEGATIVE mg/dL   Hgb urine dipstick LARGE (A) NEGATIVE   Bilirubin Urine NEGATIVE NEGATIVE   Ketones, ur NEGATIVE NEGATIVE mg/dL   Protein, ur 100 (A) NEGATIVE mg/dL   Nitrite NEGATIVE NEGATIVE   Leukocytes,Ua SMALL (A) NEGATIVE   RBC / HPF >50 (H) 0 - 5 RBC/hpf    WBC, UA 6-10 0 - 5 WBC/hpf   Bacteria, UA FEW (A) NONE SEEN   Squamous Epithelial / HPF 0-5 0 - 5   Mucus PRESENT   Basic metabolic panel  Result Value Ref Range   Sodium 136 135 - 145 mmol/L   Potassium 3.3 (L) 3.5 - 5.1 mmol/L   Chloride 107 98 - 111 mmol/L   CO2 21 (L) 22 - 32 mmol/L   Glucose, Bld 97 70 - 99 mg/dL   BUN 10 6 - 20 mg/dL   Creatinine, Ser 0.73 0.44 - 1.00 mg/dL   Calcium 8.8 (L) 8.9 - 10.3 mg/dL   GFR, Estimated >60 >60 mL/min   Anion gap 8 5 - 15  CBC  Result Value Ref Range   WBC 9.4 4.0 - 10.5 K/uL   RBC 5.19 (H) 3.87 - 5.11 MIL/uL   Hemoglobin 14.9 12.0 - 15.0 g/dL   HCT 44.5 36.0 - 46.0 %   MCV 85.7 80.0 - 100.0 fL   MCH 28.7 26.0 - 34.0 pg   MCHC 33.5 30.0 - 36.0 g/dL   RDW 13.0 11.5 - 15.5 %   Platelets 248 150 - 400 K/uL   nRBC 0.0 0.0 - 0.2 %  Pregnancy, urine  Result Value Ref Range   Preg Test, Ur NEGATIVE NEGATIVE      Assessment & Plan:   Problem List Items Addressed This Visit     Depression, major, recurrent, in partial remission ([facility]) - Primary   Generalized anxiety disorder with panic attacks   Morbid obesity with BMI of 45.0-49.9, adult ([facility])   Relevant Medications   CONTRAVE 8-90 MG TB12    Morbid Obesity BMI >49  Previously on Saxenda. No longer covered. Mancel Parsons may be covered but not available.  Start Contrave oral titration course over 4 weeks, for weight management. Notify me 3 weeks in and I can re order the maintenance dosage  Future thought - look into the newest injection > Zepbound [facility] version of weight loss) Also check into your insurance plan benefits about "weight management coverage" or consider adding in the future.  #Major depression in partial remission Anxiety Improved since last visit. Off Buspar On Sertraline 150mg  daily (100mg  tab x 1.5)  No orders of the defined types were placed in this encounter.  Meds ordered this encounter  Medications   CONTRAVE 8-90 MG TB12    Sig: Week 1: Take 1  tab daily with breakfast. Week 2: Take 1 tab twice daily with meal. Week 3: Take 2 tab with AM meal and 1 tab with PM meal. Week 4+: Take 2 tabs twice daily with meals - continue for weight loss    Dispense:  120 tablet    Refill:  0      Follow up plan: Return in about 6 months (around 09/13/2022) for 6 month fasting lab only then 1 week later Annual Physical.  Future labs ordered for 6 months   Nobie Putnam, [facility] 03/15/2022, 9:15 AM

## 2022-03-15 NOTE — Patient Instructions (Addendum)
Thank you for coming to the office today.  Contrave - oral medication, appetite suppression has wellbutrin/bupropion and naltrexone in it and it can also help with appetite, it is ordered through a speciality pharmacy. - $99 per month. 4 week starting dose Notify me 3 weeks in and I can re order the maintenance dosage  Future thought - look into the newest injection > Zepbound [facility] version of weight loss) Also check into your insurance plan benefits about "weight management coverage" or consider adding in the future.  DUE for FASTING BLOOD WORK (no food or drink after midnight before the lab appointment, only water or coffee without cream/sugar on the morning of)  SCHEDULE "Lab Only" visit in the morning at the clinic for lab draw in 6 MONTHS   - Make sure Lab Only appointment is at about 1 week before your next appointment, so that results will be available  For Lab Results, once available within 2-3 days of blood draw, you can can log in to MyChart online to view your results and a brief explanation. Also, we can discuss results at next follow-up visit.    Please schedule a Follow-up Appointment to: Return in about 6 months (around 09/13/2022) for 6 month fasting lab only then 1 week later Annual Physical.  If you have any other questions or concerns, please feel free to call the office or send a message through [facility]. You may also schedule an earlier appointment if necessary.  Additionally, you may be receiving a survey about your experience at our office within a few days to 1 week by e-mail or mail. We value your feedback.  Melissa Putnam, DO [place] [facility]

## 2022-05-17 ENCOUNTER — Other Ambulatory Visit: Payer: Self-pay

## 2022-05-17 MED ORDER — CONTRAVE 8-90 MG PO TB12: 2.0000 | ORAL_TABLET | Freq: Two times a day (BID) | ORAL | 2 refills | Status: DC

## 2022-07-10 ENCOUNTER — Other Ambulatory Visit: Payer: Self-pay

## 2022-07-10 MED ORDER — CONTRAVE 8-90 MG PO TB12
2.0000 | ORAL_TABLET | Freq: Two times a day (BID) | ORAL | 3 refills | Status: DC
Start: 2022-07-10 — End: 2022-10-02

## 2022-08-07 ENCOUNTER — Ambulatory Visit
Admission: EM | Admit: 2022-08-07 | Discharge: 2022-08-07 | Disposition: A | Discharge status: Home / Self Care | Payer: BC Managed Care – PPO

## 2022-08-07 DIAGNOSIS — M6530 Trigger finger, unspecified finger: Secondary | ICD-10-CM

## 2022-08-07 NOTE — ED Provider Notes (Signed)
[facility]    CSN: [id number] Arrival date & time: 08/07/22  1750      History   Chief Complaint Chief Complaint  Patient presents with   Hand Pain    HPI Melissa Nunez is a 37 y.o. female.   HPI  37 year old female with a past medical history significant for depression and kidney stones presents for evaluation of right ring finger entrapment for the past week.  She reports that when she closes her hand her hand contracts fine but when she tries to open it her ring finger remains stuck and she has to use her other hand to straighten her finger.  She denies any injury and she denies any numbness or tingling in her finger.  She does have some tenderness over the MCP joint of her right ring finger.  Past Medical History:  Diagnosis Date   Kidney stones 2015    Patient Active Problem List   Diagnosis Date Noted   Generalized anxiety disorder with panic attacks 08/17/2021   Depression, major, recurrent, in partial remission (HCC) 05/31/2020   Morbid obesity with BMI of 45.0-49.9, adult (HCC) 05/31/2020    Past Surgical History:  Procedure Laterality Date   [facility]LE[facility]  08/14/2019   [facility]   KIDNEY STONE SURGERY  2016    OB History     Gravida  2   Para  2   Term  2   Preterm      AB      Living  1      SAB      IAB      Ectopic      Multiple  0   Live Births  1        Obstetric Comments  2 vessel cord           Home Medications    Prior to Admission medications   Medication Sig Start Date End Date Taking? Authorizing Provider  sertraline (ZOLOFT) 100 MG tablet Take 1.5 tablets (150 mg total) by mouth daily. 09/05/21  Yes Karamalegos, Netta Neat, DO  CONTRAVE 8-90 MG TB12 Take 2 tablets by mouth 2 (two) times daily with a meal. 07/10/22   Karamalegos, Netta Neat, DO  loratadine (CLARITIN) 10 MG tablet Take 10 mg by mouth daily.  02/03/20  [provider]  promethazine (PHENERGAN) 25 MG tablet Take  25 mg by mouth every 6 (six) hours as needed for nausea or vomiting.  07/24/18  [provider]    Family History Family History  Problem Relation Age of Onset   Hypertension Mother    Bone cancer Mother        jaw   Hypertension Father    Diabetes Father    Depression Father    Heart disease Paternal Grandmother    Heart attack Paternal Grandfather 64    Social History Social History   Tobacco Use   Smoking status: Never   Smokeless tobacco: Never  Vaping Use   Vaping Use: Never used  Substance Use Topics   Alcohol use: Yes    Alcohol/week: 1.0 standard drink of alcohol    Types: 1 Cans of beer per week   Drug use: No     Allergies   Patient has no known allergies.   Review of Systems Review of Systems  Musculoskeletal:  Positive for arthralgias. Negative for joint swelling.  Skin:  Negative for color change.  Neurological:  Negative for weakness and numbness.  Physical Exam Triage Vital Signs ED Triage Vitals  Enc Vitals Group     BP      Pulse      Resp      Temp      Temp src      SpO2      Weight      Height      Head Circumference      Peak Flow      Pain Score      Pain Loc      Pain Edu?      Excl. in GC?    No data found.  Updated Vital Signs BP (!) 156/86 (BP Location: Right Arm)   Pulse 73   Temp 99 F (37.2 C) (Oral)   LMP 07/16/2022 (Approximate)   SpO2 97%   Visual Acuity Right Eye Distance:   Left Eye Distance:   Bilateral Distance:    Right Eye Near:   Left Eye Near:    Bilateral Near:     Physical Exam Vitals and nursing note reviewed.  Constitutional:      Appearance: Normal appearance. She is not ill-appearing.  HENT:     Head: Normocephalic and atraumatic.  Musculoskeletal:        General: Tenderness present. No swelling or signs of injury.  Skin:    General: Skin is warm and dry.     Capillary Refill: Capillary refill takes less than 2 seconds.     Findings: No bruising or erythema.   Neurological:     General: No focal deficit present.     Mental Status: She is alert and oriented to person, place, and time.  Psychiatric:        Mood and Affect: Mood normal.        Behavior: Behavior normal.        Thought Content: Thought content normal.        Judgment: Judgment normal.      UC Treatments / Results  Labs (all labs ordered are listed, but only abnormal results are displayed) Labs Reviewed - No data to display  EKG   Radiology No results found.  Procedures Procedures (including critical care time)  Medications Ordered in UC Medications - No data to display  Initial Impression / Assessment and Plan / UC Course  I have reviewed the triage vital signs and the nursing notes.  Pertinent labs & imaging results that were available during my care of the patient were reviewed by me and considered in my medical decision making (see chart for details).   Patient is a very pleasant, nontoxic-appearing 37 year old female who presents for evaluation of range of motion difficulties with her right ring finger.  She reports that for the last week when she closes her hand her hand contracts fine but when she tries to open it her right ring finger remains stuck in place.  She demonstrated this in the exam room and had to straighten her right ring finger with her opposite hand.  She does have tenderness with palpation of the proximal phalanx, volar aspect of the MCP joint, and following the path of the flexor tendon through her palm.  Her exam is consistent with a trigger finger.  I will provide her with the contact information for Dr. Mathis Bud at [facility], and hand specialist, so she can make an appointment for evaluation and to be given treatment options.   Final Clinical Impressions(s) / UC Diagnoses   Final diagnoses:  Trigger finger,  acquired     Discharge Instructions      Please call [facility] tomorrow morning and make an appointment with Dr.  Mathis Bud to be evaluated for your trigger finger in your right ring finger.  You may use over-the-counter Tylenol and or ibuprofen according to package instructions as needed for any pain you may experience.     ED Prescriptions   None    PDMP not reviewed this encounter.   Becky Augusta, NP 08/07/22 [phone]

## 2022-08-07 NOTE — Discharge Instructions (Addendum)
Please call [facility] tomorrow morning and make an appointment with Dr. Mathis Bud to be evaluated for your trigger finger in your right ring finger.  You may use over-the-counter Tylenol and or ibuprofen according to package instructions as needed for any pain you may experience.

## 2022-08-07 NOTE — ED Triage Notes (Signed)
Pt states she noticed that when she flexes fingers RT hand, RT ring finger stays locked down, pt reports dull pain.

## 2022-09-25 NOTE — Progress Notes (Signed)
(  Key: [id number])  form thumbnail [employer] is processing your PA request and will respond shortly with next steps. You may close this dialog, return to your dashboard, and perform other tasks. To check for an update later, open this request again from your dashboard.  If you need assistance, please chat with CoverMyMeds or call us at [phone].

## 2022-09-25 NOTE — Progress Notes (Signed)
(  Key: [id number])  Valinda Hoar [place] is processing your PA request and will respond shortly with next steps. You may close this dialog, return to your dashboard, and perform other tasks. To check for an update later, open this request again from your dashboard.  If you need assistance, please chat with CoverMyMeds or call us at [phone].

## 2022-10-02 ENCOUNTER — Ambulatory Visit: Payer: BC Managed Care – PPO | Admitting: Family Medicine

## 2022-10-02 ENCOUNTER — Encounter: Payer: Self-pay | Admitting: Family Medicine

## 2022-10-02 VITALS — BP 125/83 | HR 77 | Ht 64.0 in | Wt 284.0 lb

## 2022-10-02 DIAGNOSIS — Z6841 Body Mass Index (BMI) 40.0 and over, adult: Secondary | ICD-10-CM | POA: Diagnosis not present

## 2022-10-02 MED ORDER — PHENTERMINE HCL 37.5 MG PO TABS
37.5000 mg | ORAL_TABLET | Freq: Every day | ORAL | 0 refills | Status: DC
Start: 2022-10-02 — End: 2022-10-23

## 2022-10-02 NOTE — Progress Notes (Signed)
Subjective:    Patient ID: Melissa Nunez, female    DOB: February 19, 1985, 37 y.o.   MRN: [id number]  Melissa Nunez is a 37 y.o. female presenting on 10/02/2022 for Weight Loss   HPI  Morbid Obesity BMI >48 Previously managed with lifestyle diet interventions. Also has tried [place] off of this med. Tried Contrave oral med for 6 months, some benefit but then wt gain again, no further result Interested in other medication options. Initial wt 290 lbs, then low was 275 lbs, then some plateau and gradual weight gain, 284 lbs Admits mood and anxiety factors related to her weight Not having hypertension, cholesterol or other issues No known cardiovascular disease      10/02/2022    9:36 AM 03/15/2022    8:54 AM 08/17/2021   11:47 AM  Depression screen PHQ 2/9  Decreased Interest 1 1 3   Down, Depressed, Hopeless 0 1 3  PHQ - 2 Score 1 2 6   Altered sleeping 1 2 3   Tired, decreased energy 1 1 3   Change in appetite 0 2 2  Feeling bad or failure about yourself  0 1 3  Trouble concentrating 0 1 1  Moving slowly or fidgety/restless 0 0 1  Suicidal thoughts 0 0 0  PHQ-9 Score 3 9 19   Difficult doing work/chores Not difficult at all Not difficult at all Very difficult    Social History   Tobacco Use   Smoking status: Never   Smokeless tobacco: Never  Vaping Use   Vaping status: Never Used  Substance Use Topics   Alcohol use: Yes    Alcohol/week: 1.0 standard drink of alcohol    Types: 1 Cans of beer per week   Drug use: No    Review of Systems Per HPI unless specifically indicated above     Objective:    BP 125/83   Pulse 77   Ht 5\' 4"  (1.626 m)   Wt 284 lb (128.8 kg)   LMP 09/18/2022 (Exact Date)   SpO2 97%   BMI 48.75 kg/m   Wt Readings from Last 3 Encounters:  10/02/22 284 lb (128.8 kg)  03/15/22 290 lb (131.5 kg)  06/17/21 280 lb (127 kg)    Physical Exam Vitals and nursing note reviewed.  Constitutional:      General: She is not in acute distress.     Appearance: She is well-developed. She is obese. She is not diaphoretic.     Comments: Well-appearing, comfortable, cooperative  HENT:     Head: Normocephalic and atraumatic.  Eyes:     General:        Right eye: No discharge.        Left eye: No discharge.     Conjunctiva/sclera: Conjunctivae normal.  Neck:     Thyroid: No thyromegaly.  Cardiovascular:     Rate and Rhythm: Normal rate and regular rhythm.     Heart sounds: Normal heart sounds. No murmur heard. Pulmonary:     Effort: Pulmonary effort is normal. No respiratory distress.     Breath sounds: Normal breath sounds. No wheezing or rales.  Musculoskeletal:        General: Normal range of motion.     Cervical back: Normal range of motion and neck supple.  Lymphadenopathy:     Cervical: No cervical adenopathy.  Skin:    General: Skin is warm and dry.     Findings: No erythema or rash.  Neurological:     Mental Status: She  is alert and oriented to person, place, and time.  Psychiatric:        Behavior: Behavior normal.     Comments: Well groomed, good eye contact, normal speech and thoughts      Results for orders placed or performed during the hospital encounter of 06/17/21  Urine Culture   Specimen: Urine, Clean Catch  Result Value Ref Range   Specimen Description      URINE, CLEAN CATCH Performed at [facility], [address] [place], [place] [address]    Special Requests      NONE Performed at [facility], [address] [place], [place] [address]    Culture MULTIPLE SPECIES PRESENT, SUGGEST RECOLLECTION (A)    Report Status 06/19/2021 FINAL   Urinalysis, Routine w reflex microscopic Urine, Clean Catch  Result Value Ref Range   Color, Urine AMBER (A) YELLOW   APPearance CLOUDY (A) CLEAR   Specific Gravity, Urine 1.025 1.005 - 1.030   pH 5.0 5.0 - 8.0   Glucose, UA NEGATIVE NEGATIVE mg/dL   Hgb urine dipstick LARGE (A) NEGATIVE   Bilirubin Urine NEGATIVE NEGATIVE   Ketones, ur  NEGATIVE NEGATIVE mg/dL   Protein, ur 846 (A) NEGATIVE mg/dL   Nitrite NEGATIVE NEGATIVE   Leukocytes,Ua SMALL (A) NEGATIVE   RBC / HPF >50 (H) 0 - 5 RBC/hpf   WBC, UA 6-10 0 - 5 WBC/hpf   Bacteria, UA FEW (A) NONE SEEN   Squamous Epithelial / HPF 0-5 0 - 5   Mucus PRESENT   Basic metabolic panel  Result Value Ref Range   Sodium 136 135 - 145 mmol/L   Potassium 3.3 (L) 3.5 - 5.1 mmol/L   Chloride 107 98 - 111 mmol/L   CO2 21 (L) 22 - 32 mmol/L   Glucose, Bld 97 70 - 99 mg/dL   BUN 10 6 - 20 mg/dL   Creatinine, Ser 9.62 0.44 - 1.00 mg/dL   Calcium 8.8 (L) 8.9 - 10.3 mg/dL   GFR, Estimated >95 >28 mL/min   Anion gap 8 5 - 15  CBC  Result Value Ref Range   WBC 9.4 4.0 - 10.5 K/uL   RBC 5.19 (H) 3.87 - 5.11 MIL/uL   Hemoglobin 14.9 12.0 - 15.0 g/dL   HCT 41.3 24.4 - 01.0 %   MCV 85.7 80.0 - 100.0 fL   MCH 28.7 26.0 - 34.0 pg   MCHC 33.5 30.0 - 36.0 g/dL   RDW 27.2 53.6 - 64.4 %   Platelets 248 150 - 400 K/uL   nRBC 0.0 0.0 - 0.2 %  Pregnancy, urine  Result Value Ref Range   Preg Test, Ur NEGATIVE NEGATIVE      Assessment & Plan:   Problem List Items Addressed This Visit     Morbid obesity with BMI of 45.0-49.9, adult (HCC) - Primary   Relevant Medications   phentermine (ADIPEX-P) 37.5 MG tablet    Morbid Obesity BMI >48 Comorbid with mood and anxiety   Limited results on Contrave, 6 month trial had notable wt loss then gain again, now no effect Unable to get injectables GLP1 therapy covered   Trial on phentermine as discussed, it can have some potentially harmful side effects with elevated BP, heart rate, and cardiovascular concern. Please keep a close watch on your BP. We can re order once I hear from you if this is successful, most patients take this medicine for 3 month trial usually and can repeat again in future.  If you can get the insurance updated with Weight Loss Coverage for any of the new meds, let me know, we would need to document a new visit and weight  at that time. Ultimately we recommend GLP therapy going forward if can obtain it. Review non insurance options and future orders if indicated.   Meds ordered this encounter  Medications   phentermine (ADIPEX-P) 37.5 MG tablet    Sig: Take 1 tablet (37.5 mg total) by mouth daily before breakfast.    Dispense:  30 tablet    Refill:  0      Follow up plan: Return in about 3 months (around 01/02/2023), or 3 month Annual Physical AM apt fasting lab AFTER.  Saralyn Pilar, DO [facility] 10/02/2022, 9:43 AM

## 2022-10-02 NOTE — Patient Instructions (Addendum)
Thank you for coming to the office today.  Try the Phentermine as discussed, it can have some potentially harmful side effects with elevated BP, heart rate, and cardiovascular concern. Please keep a close watch on your BP. We can re order once I hear from you if this is successful, most patients take this medicine for 3 month trial usually and can repeat again in future.  If you can get the insurance updated with Weight Loss Coverage for any of the new meds, let me know, we would need to document a new visit and weight at that time.  DUE for FASTING BLOOD WORK (no food or drink after midnight before the lab appointment, only water or coffee without cream/sugar on the morning of)  SCHEDULE "Lab Only" visit in the morning at the clinic for lab draw in 3 MONTHS   - Make sure Lab Only appointment is at about 1 week before your next appointment, so that results will be available  For Lab Results, once available within 2-3 days of blood draw, you can can log in to MyChart online to view your results and a brief explanation. Also, we can discuss results at next follow-up visit.   Please schedule a Follow-up Appointment to: Return in about 3 months (around 01/02/2023), or 3 month Annual Physical AM apt fasting lab AFTER.  If you have any other questions or concerns, please feel free to call the office or send a message through MyChart. You may also schedule an earlier appointment if necessary.  Additionally, you may be receiving a survey about your experience at our office within a few days to 1 week by e-mail or mail. We value your feedback.  Saralyn Pilar, DO [facility], [place]

## 2022-10-09 ENCOUNTER — Other Ambulatory Visit: Payer: Self-pay | Admitting: Family Medicine

## 2022-10-09 DIAGNOSIS — F331 Major depressive disorder, recurrent, moderate: Secondary | ICD-10-CM

## 2022-10-09 NOTE — Telephone Encounter (Signed)
Requested Prescriptions  Pending Prescriptions Disp Refills   sertraline (ZOLOFT) 100 MG tablet [Pharmacy Med Name: SERTRALINE HCL 100 MG TABLET] 135 tablet 1    Sig: TAKE 1.5 TABLETS (150MG  TOTAL) BY MOUTH DAILY     Psychiatry:  Antidepressants - SSRI - sertraline Failed - 10/09/2022  1:50 AM      Failed - AST in normal range and within 360 days    AST  Date Value Ref Range Status  03/12/2021 25 15 - 41 U/L Final         Failed - ALT in normal range and within 360 days    ALT  Date Value Ref Range Status  03/12/2021 30 0 - 44 U/L Final         Passed - Completed PHQ-2 or PHQ-9 in the last 360 days      Passed - Valid encounter within last 6 months    Recent Outpatient Visits           1 week ago Morbid obesity with BMI of 45.0-49.9, adult [facility])   [place] [facility] [place], Netta Neat, DO   6 months ago Depression, major, recurrent, in partial remission [facility])   [place] [facility] [place], Netta Neat, DO   1 year ago Recurrent moderate major depressive disorder with anxiety [facility])   [place] [facility] [place], Netta Neat, DO   1 year ago Morbid obesity with BMI of 50.0-59.9, adult [facility])   [place] [facility] [place], Netta Neat, DO   1 year ago Recurrent moderate major depressive disorder with anxiety [facility])   [place] [facility] [place], Netta Neat, DO       Future Appointments             In 2 months [name], Netta Neat, DO [facility], [facility]

## 2022-10-23 ENCOUNTER — Other Ambulatory Visit: Payer: Self-pay | Admitting: Family Medicine

## 2022-10-23 DIAGNOSIS — Z6841 Body Mass Index (BMI) 40.0 and over, adult: Secondary | ICD-10-CM

## 2022-10-24 MED ORDER — PHENTERMINE HCL 37.5 MG PO TABS: 37.5000 mg | ORAL_TABLET | Freq: Every day | ORAL | 0 refills | Status: DC

## 2022-11-18 ENCOUNTER — Other Ambulatory Visit: Payer: Self-pay | Admitting: Family Medicine

## 2022-11-19 MED ORDER — PHENTERMINE HCL 37.5 MG PO TABS: 37.5000 mg | ORAL_TABLET | Freq: Every day | ORAL | 0 refills | Status: DC

## 2022-11-19 NOTE — Telephone Encounter (Signed)
Requested medications are due for refill today.  unsure  Requested medications are on the active medications list.  no  Last refill. 07/09/2022   Future visit scheduled.   yes  Notes to clinic.  Med not on med list. Medication not assigned to a protocol please review for refill.    Requested Prescriptions  Pending Prescriptions Disp Refills   CONTRAVE 8-90 MG TB12 [Pharmacy Med Name: CONTRAVE ER 8-90 MG TABLET] 120 tablet 0    Sig: take 2 tablets by mouth 2 (two) times daily with a meal.     Off-Protocol Failed - 11/18/2022  8:41 AM      Failed - Medication not assigned to a protocol, review manually.      Passed - Valid encounter within last 12 months    Recent Outpatient Visits           1 month ago Morbid obesity with BMI of 45.0-49.9, adult [facility])   [place] [facility] [place], Netta Neat, DO   8 months ago Depression, major, recurrent, in partial remission [facility])   [place] [facility] [place], Netta Neat, DO   1 year ago Recurrent moderate major depressive disorder with anxiety [facility])   [facility] Smit[facility] Cords, DO   1 year ago Morbid obesity with BMI of 50.0-59.9, adult [facility])   [place] [facility] [place], Netta Neat, DO   1 year ago Recurrent moderate major depressive disorder with anxiety [facility])   [place] [facility] Smitty Cords, DO       Future Appointments             In 1 month [name], Netta Neat, DO [place] [facility], [facility]

## 2022-11-20 ENCOUNTER — Other Ambulatory Visit: Payer: Self-pay | Admitting: Family Medicine

## 2022-11-20 NOTE — Telephone Encounter (Signed)
This is a duplicate request. I spoke to patient about this directly yesterday and she is NOT taking Contrave. This medicine was already discontinued and I declined to refill yesterday.  I asked her to notify [facility] to stop sending rx request at this time.  Can you try contacting [facility] as well to tell them to discontinue Contrave for this patient?  Thank you!  [facility], [employer], [place] - [address] [place]: [phone] F: [phone] Address  [address] [place] [address]      Saralyn Pilar, DO [facility] 11/20/2022, 6:20 PM

## 2022-11-20 NOTE — Telephone Encounter (Signed)
Requested medication (s) are due for refill today: no  Requested medication (s) are on the active medication list:no  Last refill:  07/09/22 #120  Future visit scheduled: yes  Notes to clinic:   Discontinued Ineffective Smitty Cords, DO 10/02/22 4010           Requested Prescriptions  Pending Prescriptions Disp Refills   CONTRAVE 8-90 MG TB12 [Pharmacy Med Name: CONTRAVE ER 8-90 MG TABLET] 120 tablet 0    Sig: take 2 tablets by mouth 2 (two) times daily with a meal.     Off-Protocol Failed - 11/20/2022  8:38 AM      Failed - Medication not assigned to a protocol, review manually.      Passed - Valid encounter within last 12 months    Recent Outpatient Visits           1 month ago Morbid obesity with BMI of 45.0-49.9, adult [facility])   [place] [facility] [place], Netta Neat, DO   8 months ago Depression, major, recurrent, in partial remission [facility])   [place] [facility] [place], Netta Neat, DO   1 year ago Recurrent moderate major depressive disorder with anxiety [facility])   [place] [facility] Smitty Cords, DO   1 year ago Morbid obesity with BMI of 50.0-59.9, adult [facility]   [place] [facility] [place], Netta Neat, DO   1 year ago Recurrent moderate major depressive disorder with anxiety [facility])   [place] [facility] Smitty Cords, DO       Future Appointments             In 1 month Melissa Nunez, Netta Neat, DO [place] [facility], [facility]

## 2022-11-21 ENCOUNTER — Other Ambulatory Visit: Payer: Self-pay | Admitting: Family Medicine

## 2022-11-21 NOTE — Telephone Encounter (Signed)
Duplicate request- provider has already addressed this Rx- see previous request Requested Prescriptions  Pending Prescriptions Disp Refills   CONTRAVE 8-90 MG TB12 [Pharmacy Med Name: CONTRAVE ER 8-90 MG TABLET] 120 tablet 0    Sig: take 2 tablets by mouth 2 (two) times daily with a meal.     Off-Protocol Failed - 11/21/2022  8:38 AM      Failed - Medication not assigned to a protocol, review manually.      Passed - Valid encounter within last 12 months    Recent Outpatient Visits           1 month ago Morbid obesity with BMI of 45.0-49.9, adult [facility])   [place] [facility] [place], Netta Neat, DO   8 months ago Depression, major, recurrent, in partial remission [facility])   [place] [facility], Netta Neat, DO   1 year ago Recurrent moderate major depressive disorder with anxiety [facility])   [place] [facility] Smitty Cords, DO   1 year ago Morbid obesity with BMI of 50.0-59.9, adult [facility])   [place] [facility] [place], Netta Neat, DO   1 year ago Recurrent moderate major depressive disorder with anxiety [facility])   [place] [facility] Smitty Cords, DO       Future Appointments             In 1 month Melissa Nunez, Netta Neat, DO [place] [facility], [facility]

## 2022-11-22 ENCOUNTER — Other Ambulatory Visit: Payer: Self-pay | Admitting: Family Medicine

## 2022-11-22 NOTE — Telephone Encounter (Signed)
Requested medication (s) are due for refill today: routing for approval  Requested medication (s) are on the active medication list: yes  Last refill:  unknown  Future visit scheduled: yes  Notes to clinic:  Medication not assigned to a protocol, review manually.      Requested Prescriptions  Pending Prescriptions Disp Refills   CONTRAVE 8-90 MG TB12 [Pharmacy Med Name: CONTRAVE ER 8-90 MG TABLET] 120 tablet 0    Sig: take 2 tablets by mouth 2 (two) times daily with a meal.     Off-Protocol Failed - 11/22/2022  8:38 AM      Failed - Medication not assigned to a protocol, review manually.      Passed - Valid encounter within last 12 months    Recent Outpatient Visits           1 month ago Morbid obesity with BMI of 45.0-49.9, adult [facility])   [place] [facility] [place], Netta Neat, DO   8 months ago Depression, major, recurrent, in partial remission [facility])   [place] [facility], Netta Neat, DO   1 year ago Recurrent moderate major depressive disorder with anxiety [facility])   [place] [facility] Smitty Cords, DO   1 year ago Morbid obesity with BMI of 50.0-59.9, adult [facility])   [place] [facility] [place], Netta Neat, DO   1 year ago Recurrent moderate major depressive disorder with anxiety [facility])   [place] [facility] Smitty Cords, DO       Future Appointments             In 1 month [name], Netta Neat, DO [place] [facility], [facility]

## 2022-11-23 ENCOUNTER — Other Ambulatory Visit: Payer: Self-pay | Admitting: Family Medicine

## 2022-11-23 NOTE — Telephone Encounter (Signed)
Unable to refill per protocol, Rx expired. Patient no longer taking medication.  Requested Prescriptions  Pending Prescriptions Disp Refills   CONTRAVE 8-90 MG TB12 [Pharmacy Med Name: CONTRAVE ER 8-90 MG TABLET] 120 tablet 0    Sig: take 2 tablets by mouth 2 (two) times daily with a meal.     Off-Protocol Failed - 11/23/2022  8:38 AM      Failed - Medication not assigned to a protocol, review manually.      Passed - Valid encounter within last 12 months    Recent Outpatient Visits           1 month ago Morbid obesity with BMI of 45.0-49.9, adult [facility])   [place] [facility] N[facility]z, Melissa Neat, Melissa Nunez   8 months ago Depression, major, recurrent, in partial remission [facility])   [place] [name], Melissa Neat, Melissa Nunez   1 year ago Recurrent moderate major depressive disorder with anxiety [facility])   [place] [facility] Smitty Cords, Melissa Nunez   1 year ago Morbid obesity with BMI of 50.0-59.9, adult [facility])   [place] [facility] Nunez, Melissa Neat, Melissa Nunez   1 year ago Recurrent moderate major depressive disorder with anxiety [name]   [facility] Smitty Cords, Melissa Nunez       Future Appointments             In 1 month [name], Melissa Neat, Melissa [name], [name]

## 2022-11-24 ENCOUNTER — Other Ambulatory Visit: Payer: Self-pay | Admitting: Family Medicine

## 2022-11-26 NOTE — Telephone Encounter (Signed)
Requested medications are due for refill today.  unsure  Requested medications are on the active medications list.  no  Last refill. never  Future visit scheduled.   yes  Notes to clinic.  Med never filled. Not on med list. No protocol.    Requested Prescriptions  Pending Prescriptions Disp Refills   CONTRAVE 8-90 MG TB12 [Pharmacy Med Name: CONTRAVE ER 8-90 MG TABLET] 120 tablet 0    Sig: take 2 tablets by mouth 2 (two) times daily with a meal.     Off-Protocol Failed - 11/24/2022  8:40 AM      Failed - Medication not assigned to a protocol, review manually.      Passed - Valid encounter within last 12 months    Recent Outpatient Visits           1 month ago Morbid obesity with BMI of 45.0-49.9, adult [facility])   [place] [facility] [place], Netta Neat, DO   8 months ago Depression, major, recurrent, in partial remission [facility])   [facility] [place], Netta Neat, DO   1 year ago Recurrent moderate major depressive disorder with anxiety [facility])   [place] [facility] Smitty Cords, DO   1 year ago Morbid obesity with BMI of 50.0-59.9, adult [facility])   [place] [facility] [place], Netta Neat, DO   1 year ago Recurrent moderate major depressive disorder with anxiety [facility])   [facility] Smitty Cords, DO       Future Appointments             In 1 month [name], Netta Neat, DO [place] [facility], [facility]

## 2022-11-26 NOTE — Telephone Encounter (Signed)
We have discontinued this refill 5 times in the past week.

## 2022-11-27 ENCOUNTER — Other Ambulatory Visit: Payer: Self-pay | Admitting: Family Medicine

## 2022-11-27 NOTE — Telephone Encounter (Signed)
Medication is no longer lasted on current medication list- refused by office multiple times Requested Prescriptions  Pending Prescriptions Disp Refills   CONTRAVE 8-90 MG TB12 [Pharmacy Med Name: CONTRAVE ER 8-90 MG TABLET] 120 tablet 0    Sig: take 2 tablets by mouth 2 (two) times daily with a meal.     Off-Protocol Failed - 11/27/2022  8:41 AM      Failed - Medication not assigned to a protocol, review manually.      Passed - Valid encounter within last 12 months    Recent Outpatient Visits           1 month ago Morbid obesity with BMI of 45.0-49.9, adult [facility])   [place] [facility], Netta Neat, DO   8 months ago Depression, major, recurrent, in partial remission [facility])   [place] [facility] [place], Netta Neat, DO   1 year ago Recurrent moderate major depressive disorder with anxiety [facility])   [place] [facility] Smitty Cords, DO   1 year ago Morbid obesity with BMI of 50.0-59.9, adult [facility])   [place] [facility] [place], Netta Neat, DO   1 year ago Recurrent moderate major depressive disorder with anxiety [facility])   [place] [facility] Smitty Cords, DO       Future Appointments             In 1 month [name], Netta Neat, DO [place] [facility], [facility]

## 2023-01-03 ENCOUNTER — Encounter: Payer: Self-pay | Admitting: Family Medicine

## 2023-01-03 ENCOUNTER — Ambulatory Visit (INDEPENDENT_AMBULATORY_CARE_PROVIDER_SITE_OTHER): Payer: BC Managed Care – PPO | Admitting: Family Medicine

## 2023-01-03 VITALS — BP 128/82 | Ht 64.0 in | Wt 288.0 lb

## 2023-01-03 DIAGNOSIS — Z6841 Body Mass Index (BMI) 40.0 and over, adult: Secondary | ICD-10-CM

## 2023-01-03 DIAGNOSIS — Z1159 Encounter for screening for other viral diseases: Secondary | ICD-10-CM

## 2023-01-03 DIAGNOSIS — Z131 Encounter for screening for diabetes mellitus: Secondary | ICD-10-CM

## 2023-01-03 DIAGNOSIS — F411 Generalized anxiety disorder: Secondary | ICD-10-CM

## 2023-01-03 DIAGNOSIS — F3341 Major depressive disorder, recurrent, in partial remission: Secondary | ICD-10-CM | POA: Diagnosis not present

## 2023-01-03 DIAGNOSIS — Z124 Encounter for screening for malignant neoplasm of cervix: Secondary | ICD-10-CM

## 2023-01-03 DIAGNOSIS — Z0000 Encounter for general adult medical examination without abnormal findings: Secondary | ICD-10-CM

## 2023-01-03 DIAGNOSIS — Z13220 Encounter for screening for lipoid disorders: Secondary | ICD-10-CM | POA: Diagnosis not present

## 2023-01-03 DIAGNOSIS — F410 Panic disorder [episodic paroxysmal anxiety] without agoraphobia: Secondary | ICD-10-CM

## 2023-01-03 MED ORDER — PHENTERMINE HCL 37.5 MG PO TABS: 37.5000 mg | ORAL_TABLET | Freq: Every day | ORAL | 2 refills | Status: AC

## 2023-01-03 NOTE — Progress Notes (Signed)
Subjective:    Patient ID: Melissa Nunez, female    DOB: [birth year], 37 y.o.   MRN: [id number]  Melissa Nunez is a 37 y.o. female presenting on 01/03/2023 for Annual Exam (Discuss possible arthritis symptoms )   HPI  Discussed the use of AI scribe software for clinical note transcription with the patient, who gave verbal consent to proceed.   The patient, aged 37, presented for an annual physical. She reported no new health concerns and agreed to routine blood work, including a hepatitis C test.      Osteoarthritis, multiple joints Reports hands and knees for past >1 month, hands mostly with stiffness and soreness early and then improve with movement, and worse in PM before bed. She feels like trigger fingers bilateral Ring fingers, she has to push or pop them out sometimes. She does a lot of finger flexing and folding. - Also with knees, similar pattern with early stiffness and soreness then improve, worse with prolonged standing and walking at work - She has tried Knee braces and Finger brace temporarily - She has taken Aleve AS NEEDED  Morbid Obesity BMI >49 Weight management She has been working on lifestyle diet exercise regimen for >6+ months without success. Currently on medication management with Phentermine, needs re order. Some appetite reduction and wt down to 280s. She tolerates it without elevated BP or side effect  Depression recurrent in partial remission Anxiety On Sertraline SSRI 100mg  daily doing well   Health Maintenance: Due for Cervical CA Screening Pap smear, last 2016 [facility], no history of abnormal. Referral today to return to GYN for updated pap.  Next year due for Mammogram age 39+     01/03/2023    8:47 AM 10/02/2022    9:36 AM 03/15/2022    8:54 AM  Depression screen PHQ 2/9  Decreased Interest 1 1 1   Down, Depressed, Hopeless 0 0 1  PHQ - 2 Score 1 1 2   Altered sleeping 2 1 2   Tired, decreased energy 3 1 1   Change in appetite 1 0 2   Feeling bad or failure about yourself  1 0 1  Trouble concentrating 0 0 1  Moving slowly or fidgety/restless 0 0 0  Suicidal thoughts 0 0 0  PHQ-9 Score 8 3 9   Difficult doing work/chores Not difficult at all Not difficult at all Not difficult at all       01/03/2023    8:48 AM 10/02/2022    9:36 AM 03/15/2022    8:54 AM 08/17/2021   11:54 AM  GAD 7 : Generalized Anxiety Score  Nervous, Anxious, on Edge 1 0 1 3  Control/stop worrying 1 0 0 3  Worry too much - different things 2 0 0 3  Trouble relaxing 1 0 0 3  Restless 1 0 1 3  Easily annoyed or irritable 1 1 1 3   Afraid - awful might happen 0 0 0 3  Total GAD 7 Score 7 1 3 21   Anxiety Difficulty Not difficult at all Not difficult at all Not difficult at all Very difficult     Past Medical History:  Diagnosis Date   Kidney stones 2015   Past Surgical History:  Procedure Laterality Date   CHOLECYSTECTOMY  08/14/2019   [facility]   KIDNEY STONE SURGERY  2016   Social History   Socioeconomic History   Marital status: Married    Spouse name: Not on file   Number of children: Not on  file   Years of education: Not on file   Highest education level: Not on file  Occupational History   Not on file  Tobacco Use   Smoking status: Never   Smokeless tobacco: Never  Vaping Use   Vaping status: Never Used  Substance and Sexual Activity   Alcohol use: Yes    Alcohol/week: 1.0 standard drink of alcohol    Types: 1 Cans of beer per week   Drug use: No   Sexual activity: Yes    Birth control/protection: I.U.D.  Other Topics Concern   Not on file  Social History Narrative   Not on file   Social Determinants of Health   Financial Resource Strain: Low Risk  (01/03/2023)   Overall Financial Resource Strain (CARDIA)    Difficulty of Paying Living Expenses: Not very hard  Food Insecurity: No Food Insecurity (01/03/2023)   Hunger Vital Sign    Worried About Running Out of Food in the Last Year: Never true    Ran  Out of Food in the Last Year: Never true  Transportation Needs: No Transportation Needs (01/03/2023)   PRAPARE - Administrator, Civil Service (Medical): No    Lack of Transportation (Non-Medical): No  Physical Activity: Insufficiently Active (01/03/2023)   Exercise Vital Sign    Days of Exercise per Week: 1 day    Minutes of Exercise per Session: 20 min  Stress: No Stress Concern Present (01/03/2023)   [employer] of Occupational Health - Occupational Stress Questionnaire    Feeling of Stress : Only a little  Social Connections: Socially Isolated (01/03/2023)   Social Connection and Isolation Panel [NHANES]    Frequency of Communication with Friends and Family: Once a week    Frequency of Social Gatherings with Friends and Family: Once a week    Attends Religious Services: Never    Database administrator or Organizations: No    Attends Banker Meetings: Never    Marital Status: Married  Catering manager Violence: Not At Risk (01/03/2023)   Humiliation, Afraid, Rape, and Kick questionnaire    Fear of Current or Ex-Partner: No    Emotionally Abused: No    Physically Abused: No    Sexually Abused: No   Family History  Problem Relation Age of Onset   Hypertension Mother    Bone cancer Mother        jaw   Hypertension Father    Diabetes Father    Depression Father    Heart disease Paternal Grandmother    Heart attack Paternal Grandfather 56   Current Outpatient Medications on File Prior to Visit  Medication Sig   sertraline (ZOLOFT) 100 MG tablet TAKE 1.5 TABLETS (150MG  TOTAL) BY MOUTH DAILY   [DISCONTINUED] loratadine (CLARITIN) 10 MG tablet Take 10 mg by mouth daily.   [DISCONTINUED] promethazine (PHENERGAN) 25 MG tablet Take 25 mg by mouth every 6 (six) hours as needed for nausea or vomiting.   No current facility-administered medications on file prior to visit.    Review of Systems  Constitutional:  Negative for activity change, appetite  change, chills, diaphoresis, fatigue and fever.  HENT:  Negative for congestion and hearing loss.   Eyes:  Negative for visual disturbance.  Respiratory:  Negative for cough, chest tightness, shortness of breath and wheezing.   Cardiovascular:  Negative for chest pain, palpitations and leg swelling.  Gastrointestinal:  Negative for abdominal pain, constipation, diarrhea, nausea and vomiting.  Genitourinary:  Negative for  dysuria, frequency and hematuria.  Musculoskeletal:  Positive for arthralgias. Negative for neck pain.  Skin:  Negative for rash.  Neurological:  Negative for dizziness, weakness, light-headedness, numbness and headaches.  Hematological:  Negative for adenopathy.  Psychiatric/Behavioral:  Negative for behavioral problems, dysphoric mood and sleep disturbance.    Per HPI unless specifically indicated above     Objective:    BP 128/82   Ht 5\' 4"  (1.626 m)   Wt 288 lb (130.6 kg)   LMP 12/23/2022 (Exact Date)   BMI 49.44 kg/m   Wt Readings from Last 3 Encounters:  01/03/23 288 lb (130.6 kg)  10/02/22 284 lb (128.8 kg)  03/15/22 290 lb (131.5 kg)    Physical Exam Vitals and nursing note reviewed.  Constitutional:      General: She is not in acute distress.    Appearance: She is well-developed. She is obese. She is not diaphoretic.     Comments: Well-appearing, comfortable, cooperative  HENT:     Head: Normocephalic and atraumatic.  Eyes:     General:        Right eye: No discharge.        Left eye: No discharge.     Conjunctiva/sclera: Conjunctivae normal.     Pupils: Pupils are equal, round, and reactive to light.  Neck:     Thyroid: No thyromegaly.     Vascular: No carotid bruit.  Cardiovascular:     Rate and Rhythm: Normal rate and regular rhythm.     Pulses: Normal pulses.     Heart sounds: Normal heart sounds. No murmur heard. Pulmonary:     Effort: Pulmonary effort is normal. No respiratory distress.     Breath sounds: Normal breath sounds. No  wheezing or rales.  Abdominal:     General: Bowel sounds are normal. There is no distension.     Palpations: Abdomen is soft. There is no mass.     Tenderness: There is no abdominal tenderness.  Musculoskeletal:        General: No tenderness. Normal range of motion.     Cervical back: Normal range of motion and neck supple.     Right lower leg: No edema.     Left lower leg: No edema.     Comments: Bilateral ring finger triggering seen on exam.  No hand deformity or nodularity  Upper / Lower Extremities: - Normal muscle tone, strength bilateral upper extremities 5/5, lower extremities 5/5  Lymphadenopathy:     Cervical: No cervical adenopathy.  Skin:    General: Skin is warm and dry.     Findings: No erythema or rash.  Neurological:     Mental Status: She is alert and oriented to person, place, and time.     Comments: Distal sensation intact to light touch all extremities  Psychiatric:        Mood and Affect: Mood normal.        Behavior: Behavior normal.        Thought Content: Thought content normal.     Comments: Well groomed, good eye contact, normal speech and thoughts     Results for orders placed or performed during the hospital encounter of 06/17/21  Urine Culture   Specimen: Urine, Clean Catch  Result Value Ref Range   Specimen Description      URINE, CLEAN CATCH Performed at [facility], [address] [place], [place] [address]    Special Requests      NONE Performed at [facility], [address] [place], [place] [address]    Culture MULTIPLE SPECIES PRESENT, SUGGEST RECOLLECTION (A)    Report Status 06/19/2021 FINAL   Urinalysis, Routine w reflex microscopic Urine, Clean Catch  Result Value Ref Range   Color, Urine AMBER (A) YELLOW   APPearance CLOUDY (A) CLEAR   Specific Gravity, Urine 1.025 1.005 - 1.030   pH 5.0 5.0 - 8.0   Glucose, UA NEGATIVE NEGATIVE mg/dL   Hgb urine dipstick LARGE (A) NEGATIVE   Bilirubin Urine NEGATIVE  NEGATIVE   Ketones, ur NEGATIVE NEGATIVE mg/dL   Protein, ur 578 (A) NEGATIVE mg/dL   Nitrite NEGATIVE NEGATIVE   Leukocytes,Ua SMALL (A) NEGATIVE   RBC / HPF >50 (H) 0 - 5 RBC/hpf   WBC, UA 6-10 0 - 5 WBC/hpf   Bacteria, UA FEW (A) NONE SEEN   Squamous Epithelial / HPF 0-5 0 - 5   Mucus PRESENT   Basic metabolic panel  Result Value Ref Range   Sodium 136 135 - 145 mmol/L   Potassium 3.3 (L) 3.5 - 5.1 mmol/L   Chloride 107 98 - 111 mmol/L   CO2 21 (L) 22 - 32 mmol/L   Glucose, Bld 97 70 - 99 mg/dL   BUN 10 6 - 20 mg/dL   Creatinine, Ser 4.69 0.44 - 1.00 mg/dL   Calcium 8.8 (L) 8.9 - 10.3 mg/dL   GFR, Estimated >62 >95 mL/min   Anion gap 8 5 - 15  CBC  Result Value Ref Range   WBC 9.4 4.0 - 10.5 K/uL   RBC 5.19 (H) 3.87 - 5.11 MIL/uL   Hemoglobin 14.9 12.0 - 15.0 g/dL   HCT 28.4 13.2 - 44.0 %   MCV 85.7 80.0 - 100.0 fL   MCH 28.7 26.0 - 34.0 pg   MCHC 33.5 30.0 - 36.0 g/dL   RDW 10.2 72.5 - 36.6 %   Platelets 248 150 - 400 K/uL   nRBC 0.0 0.0 - 0.2 %  Pregnancy, urine  Result Value Ref Range   Preg Test, Ur NEGATIVE NEGATIVE      Assessment & Plan:   Problem List Items Addressed This Visit     Depression, major, recurrent, in partial remission (HCC)   Generalized anxiety disorder with panic attacks   Morbid obesity with BMI of 45.0-49.9, adult (HCC)   Relevant Medications   phentermine (ADIPEX-P) 37.5 MG tablet   Other Relevant Orders   TSH   Other Visit Diagnoses     Annual physical exam    -  Primary   Relevant Orders   Hemoglobin A1c   Lipid panel   COMPLETE METABOLIC PANEL WITH GFR   CBC with Differential/Platelet   Need for hepatitis C screening test       Relevant Orders   Hepatitis C antibody   Cervical cancer screening       Relevant Orders   Ambulatory referral to Obstetrics / Gynecology   Screening for diabetes mellitus (DM)       Relevant Orders   Hemoglobin A1c   Screening, lipid       Relevant Orders   Lipid panel        Updated  Health Maintenance information Reviewed recent lab results with patient Encouraged improvement to lifestyle with diet and exercise Goal of weight loss   Arthritis, suspected Symmetrical stiffness and soreness in hands and knees, with trigger fingers in both ring fingers. No deformities or redness. Discussed the nature of arthritis and trigger fingers,  and the potential treatment options. - Note trigger fingers may require procedure in future but now not limiting function, will observe. Same treatment as arthritis  -Apply topical Voltaren on hands and knees. -Take Tylenol in the evening for pain relief. Consider X-rays for baseline -Consider referral to orthopedics if symptoms worsen or become unmanageable.  Cervical Cancer Screening Last Pap smear in 2016, due for another. -Referral to [facility] smear.  Morbid Obesity BMI >49 Weight Management Currently on Phentermine, reports it helps with appetite control. -Continue Phentermine, with refills for 3 months. -Plan for follow-up in 3 months to assess progress and tolerance.  Depression major recurrent partial remission -Continue Sertraline 100mg  daily  General Health Maintenance Annual physical today, with blood work planned. -Order blood work, including basic panel, thyroid, cholesterol, blood count, sugar, and Hepatitis C screening. -Discussed upcoming screenings for mammograms (age 85) and colon cancer (age 65).         Orders Placed This Encounter  Procedures   Hemoglobin A1c   Lipid panel    Order Specific Question:   Has the patient fasted?    Answer:   Yes   COMPLETE METABOLIC PANEL WITH GFR   CBC with Differential/Platelet   Hepatitis C antibody   TSH   Ambulatory referral to Obstetrics / Gynecology    Referral Priority:   Routine    Referral Type:   Consultation    Referral Reason:   Specialty Services Required    Requested Specialty:   Obstetrics and Gynecology    Number of Visits Requested:    1    Meds ordered this encounter  Medications   phentermine (ADIPEX-P) 37.5 MG tablet    Sig: Take 1 tablet (37.5 mg total) by mouth daily before breakfast.    Dispense:  30 tablet    Refill:  2     Follow up plan: Return in about 3 months (around 04/05/2023) for 3 month Weight, Arthritis, f/u.  Saralyn Pilar, DO [facility] 01/03/2023, 8:14 AM

## 2023-01-03 NOTE — Patient Instructions (Addendum)
Thank you for coming to the office today.    Please schedule a Follow-up Appointment to: Return in about 3 months (around 04/05/2023) for 3 month Weight, Arthritis, f/u.  If you have any other questions or concerns, please feel free to call the office or send a message through MyChart. You may also schedule an earlier appointment if necessary.  Additionally, you may be receiving a survey about your experience at our office within a few days to 1 week by e-mail or mail. We value your feedback.  Saralyn Pilar, DO [facility], [place]

## 2023-01-04 LAB — COMPLETE METABOLIC PANEL WITH GFR
AG Ratio: 1.6 (calc) (ref 1.0–2.5)
ALT: 23 U/L (ref 6–29)
AST: 19 U/L (ref 10–30)
Albumin: 4 g/dL (ref 3.6–5.1)
Alkaline phosphatase (APISO): 75 U/L (ref 31–125)
BUN: 10 mg/dL (ref 7–25)
CO2: 26 mmol/L (ref 20–32)
Calcium: 8.4 mg/dL — ABNORMAL LOW (ref 8.6–10.2)
Chloride: 104 mmol/L (ref 98–110)
Creat: 0.52 mg/dL (ref 0.50–0.97)
Globulin: 2.5 g/dL (ref 1.9–3.7)
Glucose, Bld: 88 mg/dL (ref 65–99)
Potassium: 3.7 mmol/L (ref 3.5–5.3)
Sodium: 139 mmol/L (ref 135–146)
Total Bilirubin: 0.6 mg/dL (ref 0.2–1.2)
Total Protein: 6.5 g/dL (ref 6.1–8.1)
eGFR: 121 mL/min/{1.73_m2} (ref >=60)

## 2023-01-04 LAB — LIPID PANEL
Cholesterol: 203 mg/dL — ABNORMAL HIGH (ref <200)
HDL: 48 mg/dL — ABNORMAL LOW (ref >=50)
LDL Cholesterol (Calc): 135 mg/dL — ABNORMAL HIGH
Non-HDL Cholesterol (Calc): 155 mg/dL — ABNORMAL HIGH (ref <130)
Total CHOL/HDL Ratio: 4.2 (calc) (ref <5.0)
Triglycerides: 92 mg/dL (ref <150)

## 2023-01-04 LAB — CBC WITH DIFFERENTIAL/PLATELET
Absolute Lymphocytes: 2139 {cells}/uL (ref 850–3900)
Absolute Monocytes: 651 {cells}/uL (ref 200–950)
Basophils Absolute: 47 {cells}/uL (ref 0–200)
Basophils Relative: 0.5 %
Eosinophils Absolute: 214 {cells}/uL (ref 15–500)
Eosinophils Relative: 2.3 %
HCT: 41.2 % (ref 35.0–45.0)
Hemoglobin: 13.9 g/dL (ref 11.7–15.5)
MCH: 29.4 pg (ref 27.0–33.0)
MCHC: 33.7 g/dL (ref 32.0–36.0)
MCV: 87.3 fL (ref 80.0–100.0)
MPV: 12.2 fL (ref 7.5–12.5)
Monocytes Relative: 7 %
Neutro Abs: 6250 {cells}/uL (ref 1500–7800)
Neutrophils Relative %: 67.2 %
Platelets: 231 10*3/uL (ref 140–400)
RBC: 4.72 10*6/uL (ref 3.80–5.10)
RDW: 12.6 % (ref 11.0–15.0)
Total Lymphocyte: 23 %
WBC: 9.3 10*3/uL (ref 3.8–10.8)

## 2023-01-04 LAB — HEMOGLOBIN A1C
Hgb A1c MFr Bld: 5.2 %{Hb} (ref <5.7)
Mean Plasma Glucose: 103 mg/dL
eAG (mmol/L): 5.7 mmol/L

## 2023-01-04 LAB — TSH: TSH: 1.1 m[IU]/L

## 2023-01-04 LAB — HEPATITIS C ANTIBODY: Hepatitis C Ab: NONREACTIVE

## 2023-01-30 ENCOUNTER — Telehealth: Payer: BC Managed Care – PPO | Admitting: Physician Assistant

## 2023-01-30 DIAGNOSIS — J020 Streptococcal pharyngitis: Secondary | ICD-10-CM | POA: Diagnosis not present

## 2023-01-30 MED ORDER — AMOXICILLIN 500 MG PO TABS
500.0000 mg | ORAL_TABLET | Freq: Two times a day (BID) | ORAL | 0 refills | Status: AC
Start: 2023-01-30 — End: 2023-02-09

## 2023-01-30 NOTE — Progress Notes (Signed)

## 2023-01-30 NOTE — Progress Notes (Signed)
I have spent 5 minutes in review of e-visit questionnaire, review and updating patient chart, medical decision making and response to patient.   Mia Milan Cody Jacklynn Dehaas, PA-C    

## 2023-04-02 ENCOUNTER — Encounter: Payer: Self-pay | Admitting: Family Medicine

## 2023-04-03 ENCOUNTER — Other Ambulatory Visit: Payer: Self-pay | Admitting: Family Medicine

## 2023-04-03 DIAGNOSIS — F331 Major depressive disorder, recurrent, moderate: Secondary | ICD-10-CM

## 2023-04-03 NOTE — Telephone Encounter (Signed)
Requested Prescriptions  Pending Prescriptions Disp Refills   sertraline (ZOLOFT) 100 MG tablet [Pharmacy Med Name: SERTRALINE HCL 100 MG TABLET] 135 tablet 0    Sig: TAKE 1.5 TABLETS (150MG  TOTAL) BY MOUTH DAILY     Psychiatry:  Antidepressants - SSRI - sertraline Passed - 04/03/2023  3:52 PM      Passed - AST in normal range and within 360 days    AST  Date Value Ref Range Status  01/03/2023 19 10 - 30 U/L Final         Passed - ALT in normal range and within 360 days    ALT  Date Value Ref Range Status  01/03/2023 23 6 - 29 U/L Final         Passed - Completed PHQ-2 or PHQ-9 in the last 360 days      Passed - Valid encounter within last 6 months    Recent Outpatient Visits           3 months ago Annual physical exam   [place] [facility] [place], Netta Neat, DO   6 months ago Morbid obesity with BMI of 45.0-49.9, adult [facility])   [place] [facility] [place], Netta Neat, DO   1 year ago Depression, major, recurrent, in partial remission [facility])   [place] [facility] [place], Netta Neat, DO   1 year ago Recurrent moderate major depressive disorder with anxiety [facility])   [place] [facility] Smitty Cords, DO   1 year ago Morbid obesity with BMI of 50.0-59.9, adult [facility])   [place] [facility] [place], Netta Neat, DO       Future Appointments             In 2 days [name], Netta Neat, DO [place] [facility], [facility]

## 2023-04-05 ENCOUNTER — Ambulatory Visit: Payer: Self-pay | Admitting: Family Medicine

## 2023-04-09 ENCOUNTER — Telehealth: Payer: BC Managed Care – PPO | Admitting: Nurse Practitioner

## 2023-04-09 DIAGNOSIS — Z6841 Body Mass Index (BMI) 40.0 and over, adult: Secondary | ICD-10-CM | POA: Diagnosis not present

## 2023-04-09 DIAGNOSIS — R6889 Other general symptoms and signs: Secondary | ICD-10-CM

## 2023-04-09 DIAGNOSIS — E66813 Obesity, class 3: Secondary | ICD-10-CM | POA: Diagnosis not present

## 2023-04-09 MED ORDER — OSELTAMIVIR PHOSPHATE 75 MG PO CAPS: 75.0000 mg | ORAL_CAPSULE | Freq: Two times a day (BID) | ORAL | 0 refills | Status: AC

## 2023-04-09 MED ORDER — FLUTICASONE PROPIONATE 50 MCG/ACT NA SUSP: 2.0000 | Freq: Every day | NASAL | 6 refills | Status: AC

## 2023-04-09 NOTE — Progress Notes (Signed)
 E visit for Flu like symptoms   We are sorry that you are not feeling well.  Here is how we plan to help! Based on what you have shared with me it looks like you may have a respiratory virus that may be influenza.  Influenza or "the flu" is   an infection caused by a respiratory virus. The flu virus is highly contagious and persons who did not receive their yearly flu vaccination may "catch" the flu from close contact.  We have anti-viral medications to treat the viruses that cause this infection. They are not a "cure" and only shorten the course of the infection. These prescriptions are most effective when they are given within the first 2 days of "flu" symptoms. Antiviral medication are indicated if you have a high risk of complications from the flu. You should  also consider an antiviral medication if you are in close contact with someone who is at risk. These medications can help patients avoid complications from the flu  but have side effects that you should know. Possible side effects from Tamiflu or oseltamivir include nausea, vomiting, diarrhea, dizziness, headaches, eye redness, sleep problems or other respiratory symptoms. You should not take Tamiflu if you have an allergy to oseltamivir or any to the ingredients in Tamiflu.  Based upon your symptoms and potential risk factors I have prescribed Oseltamivir (Tamiflu).  It has been sent to your designated pharmacy.  You will take one 75 mg capsule orally twice a day for the next 5 days.  Meds ordered this encounter  Medications   oseltamivir (TAMIFLU) 75 MG capsule    Sig: Take 1 capsule (75 mg total) by mouth 2 (two) times daily for 5 days.    Dispense:  10 capsule    Refill:  0   fluticasone (FLONASE) 50 MCG/ACT nasal spray    Sig: Place 2 sprays into both nostrils daily.    Dispense:  16 g    Refill:  6     ANYONE WHO HAS FLU SYMPTOMS SHOULD: Stay home. The flu is highly contagious and going out or to work exposes others! Be sure  to drink plenty of fluids. Water is fine as well as fruit juices, sodas and electrolyte beverages. You may want to stay away from caffeine or alcohol. If you are nauseated, try taking small sips of liquids. How do you know if you are getting enough fluid? Your urine should be a pale yellow or almost colorless. Get rest. Taking a steamy shower or using a humidifier may help nasal congestion and ease sore throat pain. Using a saline nasal spray works much the same way. Cough drops, hard candies and sore throat lozenges may ease your cough. Line up a caregiver. Have someone check on you regularly.   GET HELP RIGHT AWAY IF: You cannot keep down liquids or your medications. You become short of breath Your fell like you are going to pass out or loose consciousness. Your symptoms persist after you have completed your treatment plan MAKE SURE YOU  Understand these instructions. Will watch your condition. Will get help right away if you are not doing well or get worse.  Your e-visit answers were reviewed by a board certified advanced clinical practitioner to complete your personal care plan.  Depending on the condition, your plan could have included both over the counter or prescription medications.  If there is a problem please reply  once you have received a response from your provider.  Your safety is  important to [place].  If you have drug allergies check your prescription carefully.    You can use MyChart to ask questions about today's visit, request a non-urgent call back, or ask for a work or school excuse for 24 hours related to this e-Visit. If it has been greater than 24 hours you will need to follow up with your provider, or enter a new e-Visit to address those concerns.  You will get an e-mail in the next two days asking about your experience.  I hope that your e-visit has been valuable and will speed your recovery. Thank you for using e-visits.   I spent approximately 5 minutes reviewing  the patient's history, current symptoms and coordinating their care today.

## 2023-05-09 DIAGNOSIS — Z6841 Body Mass Index (BMI) 40.0 and over, adult: Secondary | ICD-10-CM | POA: Diagnosis not present

## 2023-05-09 DIAGNOSIS — E66813 Obesity, class 3: Secondary | ICD-10-CM | POA: Diagnosis not present

## 2023-06-01 ENCOUNTER — Telehealth

## 2023-06-01 ENCOUNTER — Ambulatory Visit
Admission: EM | Admit: 2023-06-01 | Discharge: 2023-06-01 | Disposition: A | Discharge status: Home / Self Care | Attending: Family Medicine | Admitting: Family Medicine

## 2023-06-01 DIAGNOSIS — L259 Unspecified contact dermatitis, unspecified cause: Secondary | ICD-10-CM | POA: Diagnosis not present

## 2023-06-01 MED ORDER — PREDNISONE 10 MG (21) PO TBPK: ORAL_TABLET | Freq: Every day | ORAL | 0 refills | Status: AC

## 2023-06-01 MED ORDER — TRIAMCINOLONE ACETONIDE 0.1 % EX OINT: 1.0000 | TOPICAL_OINTMENT | Freq: Two times a day (BID) | CUTANEOUS | 0 refills | Status: AC

## 2023-06-01 NOTE — ED Provider Notes (Signed)
 [facility]    CSN: [id number] Arrival date & time: 06/01/23  1225      History   Chief Complaint No chief complaint on file.   HPI Melissa Nunez is a 38 y.o. female.   HPI  Melissa Nunez presents for itchy facial rash and on her forearms that started last night. She was working in a flower bed and thinks she came in contact with poison ivy.  Woke up with swelling and redness of her face.     There is been no new products including soaps and detergents.  No eye irritation, sore throat, difficulty breathing, nausea, vomiting or diarrhea.  Denies belly pain, joint pain and fever.  There has been no medication changes or new supplements.  Denies any new foods or drinks.     Past Medical History:  Diagnosis Date   Kidney stones 2015    Patient Active Problem List   Diagnosis Date Noted   Generalized anxiety disorder with panic attacks 08/17/2021   Depression, major, recurrent, in partial remission (HCC) 05/31/2020   Morbid obesity with BMI of 45.0-49.9, adult (HCC) 05/31/2020   Seasonal allergic rhinitis due to pollen 08/16/2016   Postpartum depression 08/16/2016   CF (cystic fibrosis) (HCC) 06/23/2015    Past Surgical History:  Procedure Laterality Date   [facility]  [facility]/2019   [facility]   KIDNEY STONE SURGERY  2016    OB History     Gravida  2   Para  2   Term  2   Preterm      AB      Living  1      SAB      IAB      Ectopic      Multiple  0   Live Births  1        Obstetric Comments  2 vessel cord           Home Medications    Prior to Admission medications   Medication Sig Start Date End Date Taking? Authorizing Provider  predniSONE  (STERAPRED UNI-PAK 21 TAB) 10 MG (21) TBPK tablet Take by mouth daily. Take 6 tabs by mouth daily for 1, then 5 tabs for 1 day, then 4 tabs for 1 day, then 3 tabs for 1 day, then 2 tabs for 1 day, then 1 tab for 1 day. 06/01/23  Yes Nyree Applegate, DO  triamcinolone  ointment  (KENALOG ) 0.1 % Apply 1 Application topically 2 (two) times daily. 06/01/23  Yes Eula Mazzola, DO  fluticasone  (FLONASE ) 50 MCG/ACT nasal spray Place 2 sprays into both nostrils daily. 04/09/23   Mardene Shake, FNP  phentermine  (ADIPEX-P ) 37.5 MG tablet Take 1 tablet (37.5 mg total) by mouth daily before breakfast. 01/03/23   Romeo Co, Kayleen Party, DO  sertraline  (ZOLOFT ) 100 MG tablet TAKE 1.5 TABLETS (150MG  TOTAL) BY MOUTH DAILY 04/03/23   Romeo Co, Kayleen Party, DO  loratadine (CLARITIN) 10 MG tablet Take 10 mg by mouth daily.  02/03/20  [provider]  promethazine  (PHENERGAN ) 25 MG tablet Take 25 mg by mouth every 6 (six) hours as needed for nausea or vomiting.  07/24/18  [provider]    Family History Family History  Problem Relation Age of Onset   Hypertension Mother    Bone cancer Mother        jaw   Hypertension Father    Diabetes Father    Depression Father    Heart disease Paternal Grandmother    Heart  attack Paternal Grandfather 42    Social History Social History   Tobacco Use   Smoking status: Never   Smokeless tobacco: Never  Vaping Use   Vaping status: Never Used  Substance Use Topics   Alcohol use: Yes    Alcohol/week: 1.0 standard drink of alcohol    Types: 1 Cans of beer per week   Drug use: No     Allergies   Patient has no known allergies.   Review of Systems Review of Systems :negative unless otherwise stated in HPI.      Physical Exam Triage Vital Signs ED Triage Vitals  Encounter Vitals Group     BP 06/01/23 1238 (!) 137/93     Systolic BP Percentile --      Diastolic BP Percentile --      Pulse Rate 06/01/23 1238 69     Resp --      Temp 06/01/23 1238 98.8 F (37.1 C)     Temp Source 06/01/23 1238 Oral     SpO2 06/01/23 1238 98 %     Weight 06/01/23 1235 296 lb (134.3 kg)     Height 06/01/23 1235 5\' 4"  (1.626 m)     Head Circumference --      Peak Flow --      Pain Score 06/01/23 1235 2     Pain Loc --       Pain Education --      Exclude from Growth Chart --    No data found.  Updated Vital Signs BP (!) 137/93 (BP Location: Left Arm)   Pulse 69   Temp 98.8 F (37.1 C) (Oral)   Ht 5\' 4"  (1.626 m)   Wt 134.3 kg   LMP 05/05/2023   SpO2 98%   BMI 50.81 kg/m   Visual Acuity Right Eye Distance:   Left Eye Distance:   Bilateral Distance:    Right Eye Near:   Left Eye Near:    Bilateral Near:     Physical Exam  GEN: alert, well appearing female, in no acute distress  EYES: no scleral injection or discharge, left circumferential eye is erythematous with papules no pustules or blisters, erythematous right pinna with papules and warmth CV: regular rate RESP: no increased work of breathing NEURO: alert, moves all extremities appropriately SKIN: warm and dry; blistering on the right forearm near the wrist and hand    UC Treatments / Results  Labs (all labs ordered are listed, but only abnormal results are displayed) Labs Reviewed - No data to display  EKG   Radiology No results found.  Procedures Procedures (including critical care time)  Medications Ordered in UC Medications - No data to display  Initial Impression / Assessment and Plan / UC Course  I have reviewed the triage vital signs and the nursing notes.  Pertinent labs & imaging results that were available during my care of the patient were reviewed by me and considered in my medical decision making (see chart for details).      Contact Dermatitis Patient is a 38 y.o. female who presents for worsening rash for the day.  Overall, patient is well-appearing and well-hydrated.  Vital signs stable.  [name] is afebrile.  History and exam concerning for contact dermatitis.  Treat with prednisone  taper and steroid ointment.  Claritin or Zyrtec twice a day for additional itch relief. No sign of infection to suggest antifungals or antibiotics at this time.   Reviewed expectations regarding course of  current medical issues.  All questions asked were answered.  Outlined signs and symptoms indicating need for more acute intervention. Patient verbalized understanding. After Visit Summary given.   Final Clinical Impressions(s) / UC Diagnoses   Final diagnoses:  Contact dermatitis, unspecified contact dermatitis type, unspecified trigger     Discharge Instructions      Stop by the pharmacy to pick up your prescriptions.  Follow up with your primary care provider or return to the urgent care, if not improving.   See handout attached.       ED Prescriptions     Medication Sig Dispense Auth. Provider   triamcinolone  ointment (KENALOG ) 0.1 % Apply 1 Application topically 2 (two) times daily. 30 g Hiroyuki Ozanich, DO   predniSONE  (STERAPRED UNI-PAK 21 TAB) 10 MG (21) TBPK tablet Take by mouth daily. Take 6 tabs by mouth daily for 1, then 5 tabs for 1 day, then 4 tabs for 1 day, then 3 tabs for 1 day, then 2 tabs for 1 day, then 1 tab for 1 day. 21 tablet Parys Elenbaas, DO      PDMP not reviewed this encounter.              Teale Goodgame, DO 06/01/23 1313

## 2023-06-01 NOTE — Discharge Instructions (Addendum)
 Stop by the pharmacy to pick up your prescriptions.  Follow up with your primary care provider or return to the urgent care, if not improving.   See handout attached.

## 2023-06-01 NOTE — ED Triage Notes (Addendum)
 Patient states she woke up this morning with left eye swelling and right ear redness and burning. Treated with Benadryl  last night. Patient states she was cleaning out a garden bed on Wednesday.

## 2023-06-02 ENCOUNTER — Ambulatory Visit: Payer: Self-pay

## 2023-06-06 DIAGNOSIS — N23 Unspecified renal colic: Secondary | ICD-10-CM | POA: Diagnosis not present

## 2023-06-06 DIAGNOSIS — Z79899 Other long term (current) drug therapy: Secondary | ICD-10-CM | POA: Diagnosis not present

## 2023-06-06 DIAGNOSIS — R109 Unspecified abdominal pain: Secondary | ICD-10-CM | POA: Diagnosis not present

## 2023-06-06 DIAGNOSIS — R110 Nausea: Secondary | ICD-10-CM | POA: Diagnosis not present

## 2023-06-06 DIAGNOSIS — Z9049 Acquired absence of other specified parts of digestive tract: Secondary | ICD-10-CM | POA: Diagnosis not present

## 2023-06-06 DIAGNOSIS — F419 Anxiety disorder, unspecified: Secondary | ICD-10-CM | POA: Diagnosis not present

## 2023-06-06 DIAGNOSIS — N200 Calculus of kidney: Secondary | ICD-10-CM | POA: Diagnosis not present

## 2023-06-06 DIAGNOSIS — R10817 Generalized abdominal tenderness: Secondary | ICD-10-CM | POA: Diagnosis not present

## 2023-06-06 DIAGNOSIS — R1031 Right lower quadrant pain: Secondary | ICD-10-CM | POA: Diagnosis not present

## 2023-07-04 ENCOUNTER — Other Ambulatory Visit: Payer: Self-pay | Admitting: Family Medicine

## 2023-07-04 DIAGNOSIS — F331 Major depressive disorder, recurrent, moderate: Secondary | ICD-10-CM

## 2023-07-05 NOTE — Telephone Encounter (Signed)
 30 day courtesy refill Requested Prescriptions  Pending Prescriptions Disp Refills   sertraline  (ZOLOFT ) 100 MG tablet [Pharmacy Med Name: SERTRALINE  HCL 100 MG TABLET] 45 tablet 0    Sig: TAKE 1.5 TABLETS (150MG  TOTAL) BY MOUTH DAILY     Psychiatry:  Antidepressants - SSRI - sertraline  Failed - 07/05/2023  9:56 AM      Failed - Valid encounter within last 6 months    Recent Outpatient Visits   None            Passed - AST in normal range and within 360 days    AST  Date Value Ref Range Status  01/03/2023 19 10 - 30 U/L Final         Passed - ALT in normal range and within 360 days    ALT  Date Value Ref Range Status  01/03/2023 23 6 - 29 U/L Final         Passed - Completed PHQ-2 or PHQ-9 in the last 360 days

## 2023-12-16 IMAGING — CT CT ABD-PELV W/ CM
2 of 4 series · 16 of 46 positions shown, 18 images · IV contrast (APPLIED)
Comparison: 04/03/2014.

CLINICAL DATA: Abdominal pain. Right lower quadrant pain with
nausea vomiting for 1 day.

EXAM:
CT ABDOMEN AND PELVIS WITH CONTRAST
TECHNIQUE: Multidetector CT imaging of the abdomen and pelvis was performed
using the standard protocol following bolus administration of
intravenous contrast.

[Series 2: routine abd/pel with · axial · 0.98mm/px · z∈[-892,-417]mm · 13 of 105 slices shown, 15 images]
[im 5/105  soft-tissue]
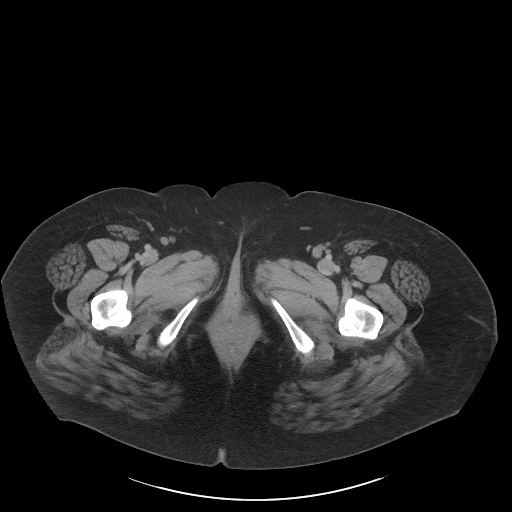
[im 5/105  bone]
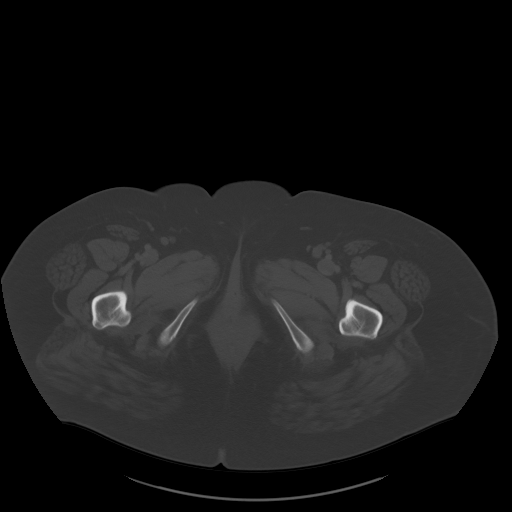
[im 13/105  soft-tissue]
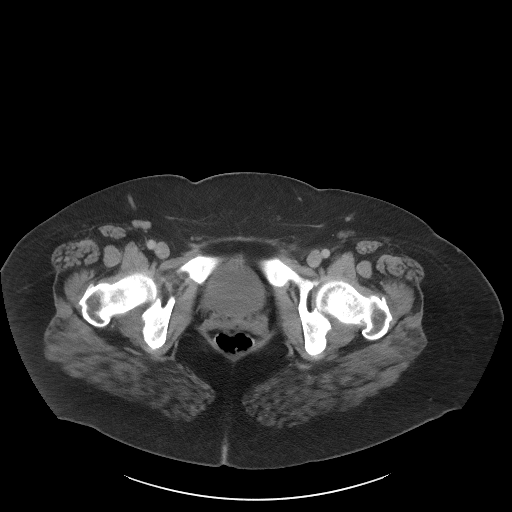
[im 21/105  soft-tissue]
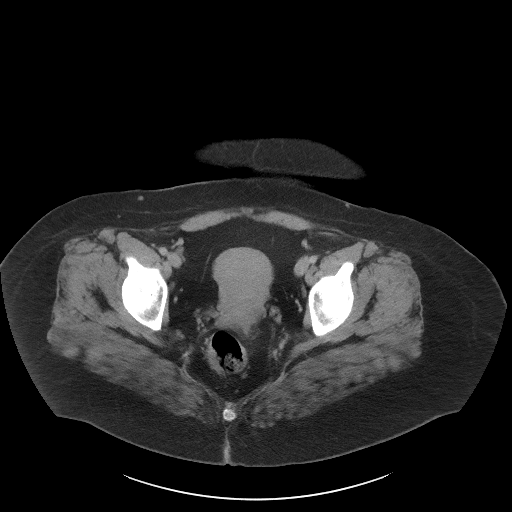
[im 30/105  soft-tissue]
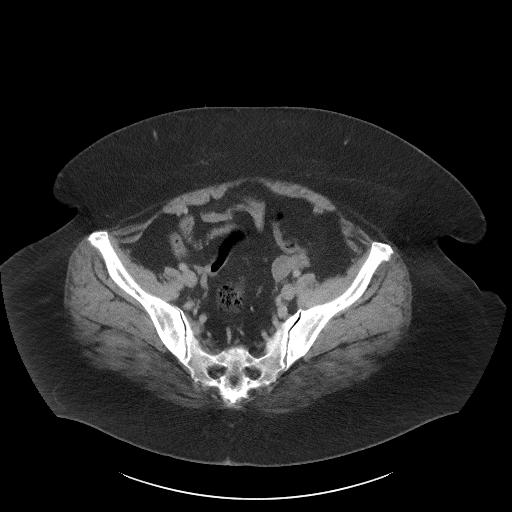
[im 38/105  soft-tissue]
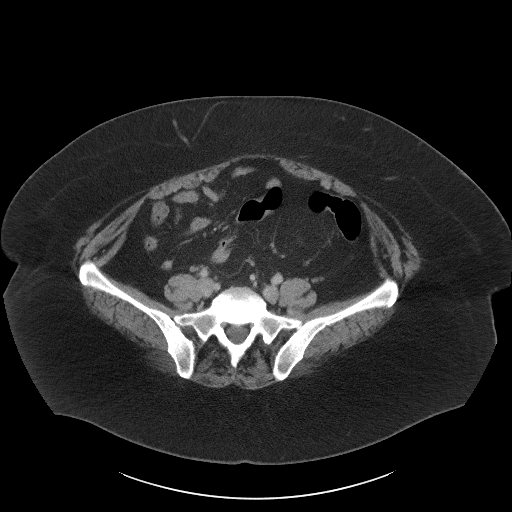
[im 46/105  soft-tissue]
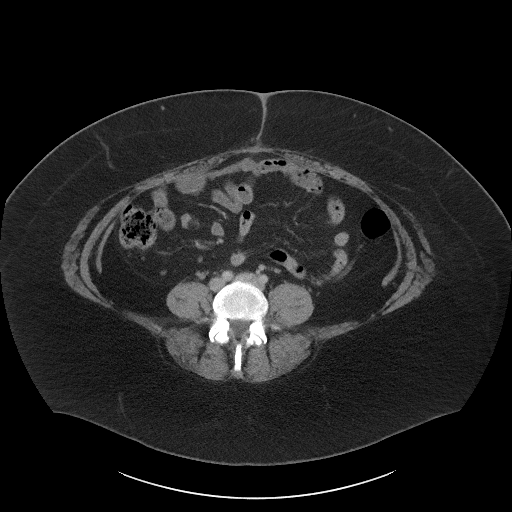
[im 55/105  soft-tissue]
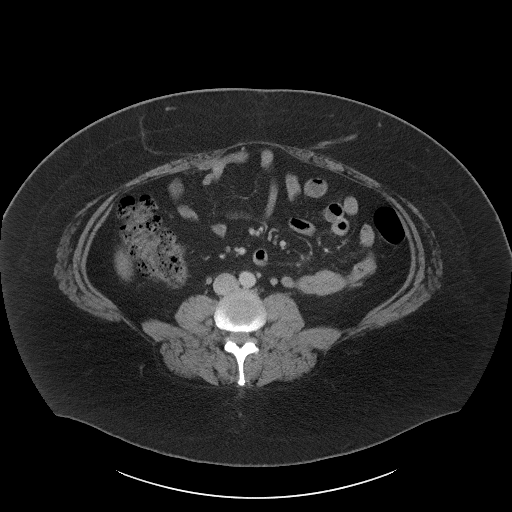
[im 59/105  soft-tissue]
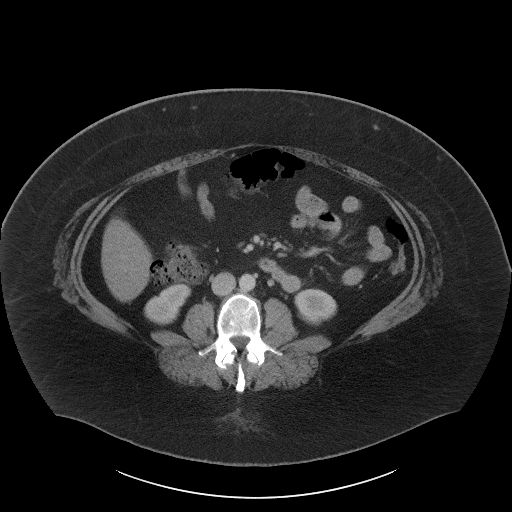
[im 67/105  soft-tissue]
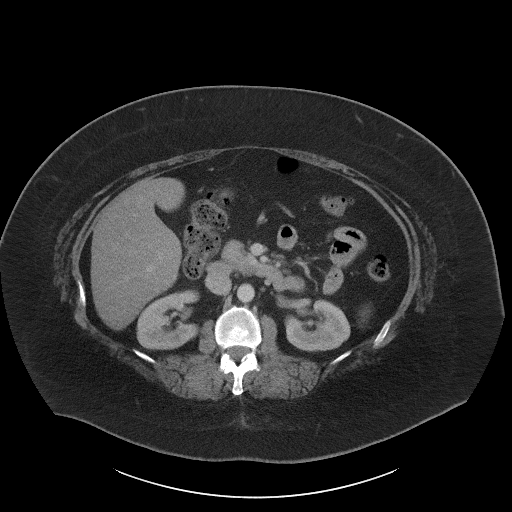
[im 67/105  bone]
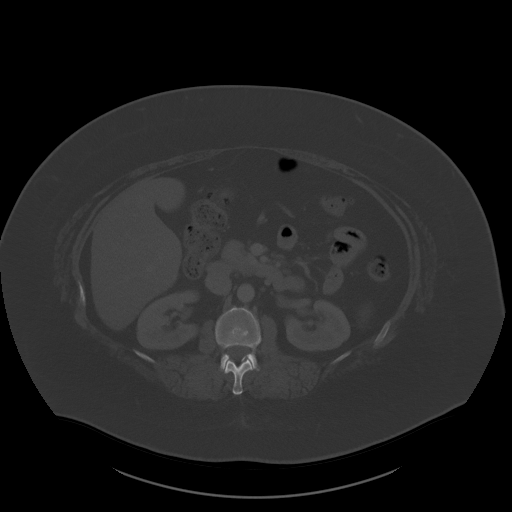
[im 75/105  soft-tissue]
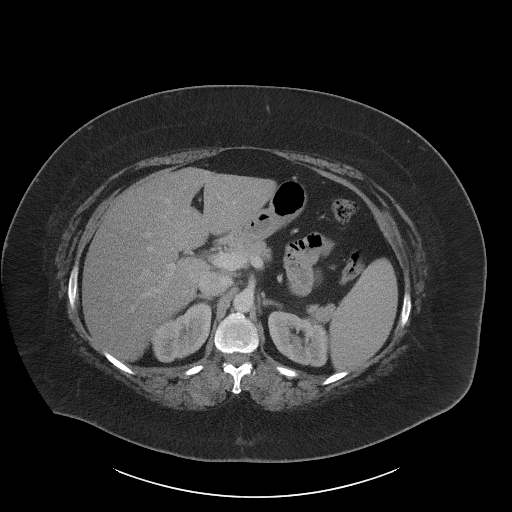
[im 84/105  soft-tissue]
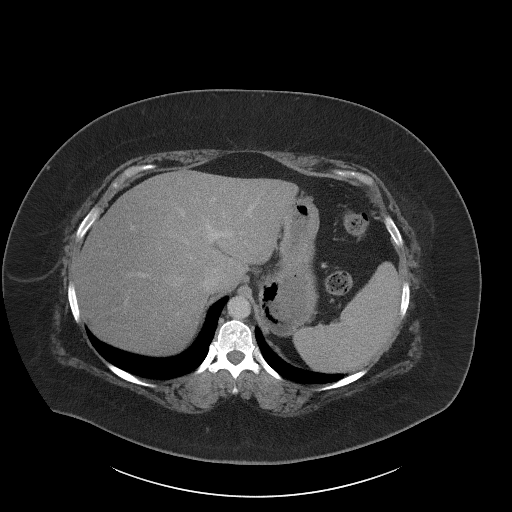
[im 92/105  soft-tissue]
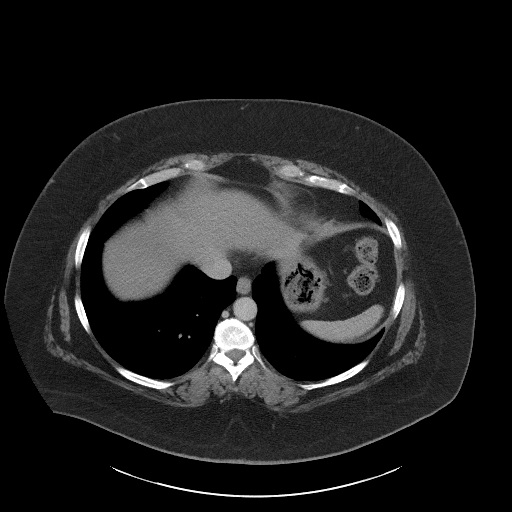
[im 100/105  soft-tissue]
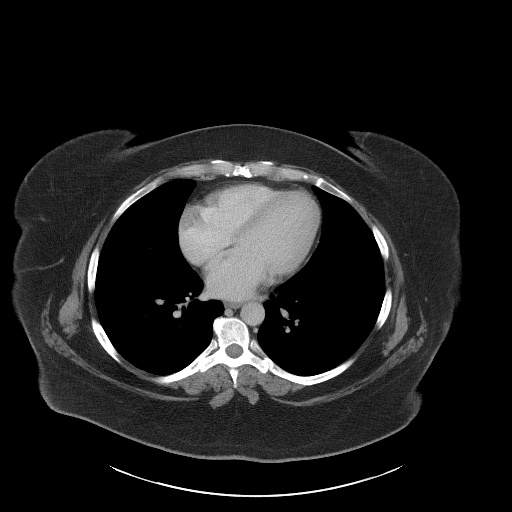

[Series 5: coronal st · coronal · 0.89mm/px · 3 of 126 slices shown]
[im 42/126  soft-tissue]
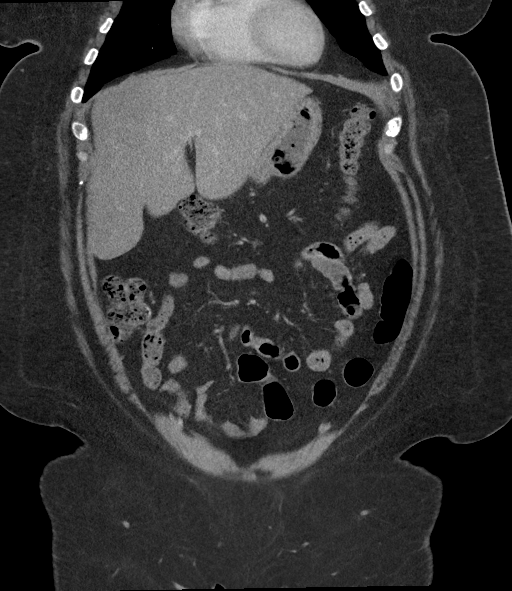
[im 56/126  soft-tissue]
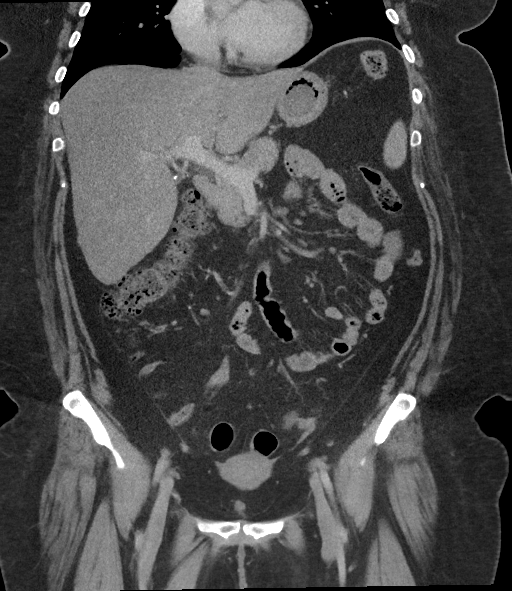
[im 70/126  soft-tissue]
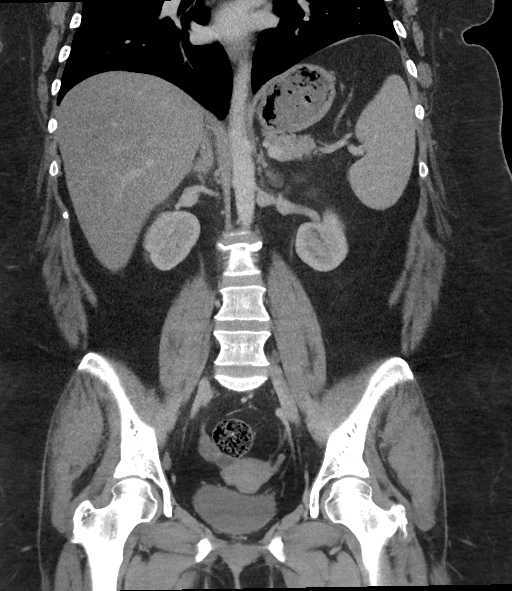

[16 of 46 positions shown; findings below may reference images not displayed]

RADIATION DOSE REDUCTION: This exam was performed according to the
departmental dose-optimization program which includes automated
exposure control, adjustment of the mA and/or kV according to
patient size and/or use of iterative reconstruction technique.

CONTRAST:  100mL OMNIPAQUE IOHEXOL 300 MG/ML  SOLN
FINDINGS: Lower chest: Clear lung bases.

Hepatobiliary: No focal liver abnormality is seen. Status post
cholecystectomy. No biliary dilatation.

Pancreas: Unremarkable. No pancreatic ductal dilatation or
surrounding inflammatory changes.

Spleen: Normal in size without focal abnormality.

Adrenals/Urinary Tract: No adrenal masses.

Kidneys normal size, orientation and position with symmetric
enhancement. There are small nonobstructing stones, 1 in each
kidney, midpole on the right and lower pole on the left. No renal
masses. No hydronephrosis. Normal ureters. Normal bladder.

Stomach/Bowel: Stomach is within normal limits. Appendix appears
normal. No evidence of bowel wall thickening, distention, or
inflammatory changes.

Vascular/Lymphatic: No significant vascular findings are present. No
enlarged abdominal or pelvic lymph nodes.

Reproductive: Uterus and bilateral adnexa are unremarkable.

Other: No abdominal wall hernia or abnormality. No abdominopelvic
ascites.

Musculoskeletal: No fracture or acute finding.  No bone lesion.
IMPRESSION: 1. No acute findings within the abdomen or pelvis. No findings to
account for the patient's pain. Normal appendix visualized.
2. Single small nonobstructing stones in each kidney. No ureteral
stones or obstructive uropathy.

## 2023-12-30 ENCOUNTER — Other Ambulatory Visit: Payer: Self-pay | Admitting: Family Medicine

## 2023-12-30 DIAGNOSIS — F331 Major depressive disorder, recurrent, moderate: Secondary | ICD-10-CM

## 2024-01-02 NOTE — Telephone Encounter (Signed)
 Courtesy refill given, appointment needed.   Requested Prescriptions  Pending Prescriptions Disp Refills   sertraline  (ZOLOFT ) 100 MG tablet [Pharmacy Med Name: SERTRALINE  HCL 100 MG TABLET] 135 tablet 1    Sig: TAKE 1.5 TABLETS (150 MG TOTAL) BY MOUTH DAILY     Psychiatry:  Antidepressants - SSRI - sertraline  Failed - 01/02/2024  9:14 AM      Failed - AST in normal range and within 360 days    AST  Date Value Ref Range Status  01/03/2023 19 10 - 30 U/L Final         Failed - ALT in normal range and within 360 days    ALT  Date Value Ref Range Status  01/03/2023 23 6 - 29 U/L Final         Failed - Completed PHQ-2 or PHQ-9 in the last 360 days      Failed - Valid encounter within last 6 months    Recent Outpatient Visits   None

## 2024-01-03 ENCOUNTER — Other Ambulatory Visit: Payer: Self-pay | Admitting: Family Medicine

## 2024-01-03 DIAGNOSIS — F331 Major depressive disorder, recurrent, moderate: Secondary | ICD-10-CM

## 2024-01-05 NOTE — Telephone Encounter (Signed)
 Patient needs appointment- duplicate request Requested Prescriptions  Pending Prescriptions Disp Refills   sertraline  (ZOLOFT ) 100 MG tablet [Pharmacy Med Name: SERTRALINE  HCL 100 MG TABLET] 45 tablet 0    Sig: TAKE 1.5 TABLETS (150 MG TOTAL) BY MOUTH DAILY     Psychiatry:  Antidepressants - SSRI - sertraline  Failed - 01/05/2024  7:48 AM      Failed - AST in normal range and within 360 days    AST  Date Value Ref Range Status  01/03/2023 19 10 - 30 U/L Final         Failed - ALT in normal range and within 360 days    ALT  Date Value Ref Range Status  01/03/2023 23 6 - 29 U/L Final         Failed - Completed PHQ-2 or PHQ-9 in the last 360 days      Failed - Valid encounter within last 6 months    Recent Outpatient Visits   None

## 2024-01-23 DIAGNOSIS — Z6841 Body Mass Index (BMI) 40.0 and over, adult: Secondary | ICD-10-CM | POA: Diagnosis not present

## 2024-01-23 DIAGNOSIS — Z79899 Other long term (current) drug therapy: Secondary | ICD-10-CM | POA: Diagnosis not present

## 2024-01-23 DIAGNOSIS — E66813 Obesity, class 3: Secondary | ICD-10-CM | POA: Diagnosis not present

## 2024-01-23 DIAGNOSIS — Z713 Dietary counseling and surveillance: Secondary | ICD-10-CM | POA: Diagnosis not present

## 2024-02-07 DIAGNOSIS — M79645 Pain in left finger(s): Secondary | ICD-10-CM | POA: Diagnosis not present

## 2024-02-11 DIAGNOSIS — Z6841 Body Mass Index (BMI) 40.0 and over, adult: Secondary | ICD-10-CM | POA: Diagnosis not present

## 2024-02-11 DIAGNOSIS — Z713 Dietary counseling and surveillance: Secondary | ICD-10-CM | POA: Diagnosis not present

## 2024-02-11 DIAGNOSIS — Z79899 Other long term (current) drug therapy: Secondary | ICD-10-CM | POA: Diagnosis not present

## 2024-03-22 IMAGING — CT CT RENAL STONE PROTOCOL
2 of 4 series · 16 of 46 positions shown, 18 images · non-contrast
Comparison: 03/12/2021

CLINICAL DATA: Acute onset left flank pain 1 hour ago. Nausea and
vomiting. Nephrolithiasis.



[Series 2: ap without · axial · non-contrast · 0.93mm/px · z∈[-1230,-730]mm · 13 of 114 slices shown, 15 images]
[im 7/114  soft-tissue]
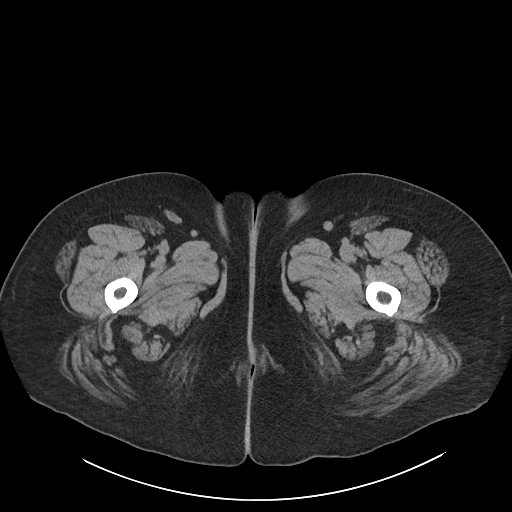
[im 7/114  bone]
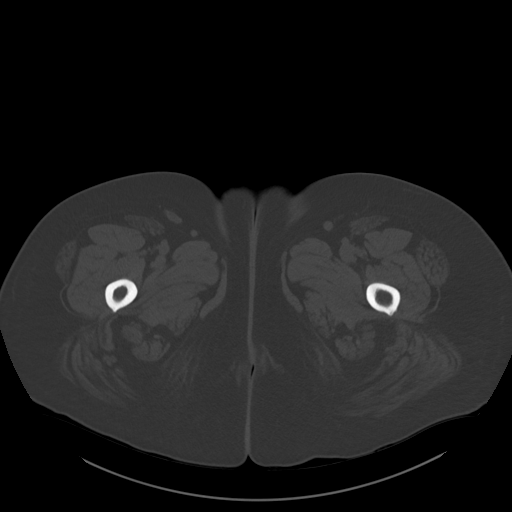
[im 13/114  soft-tissue]
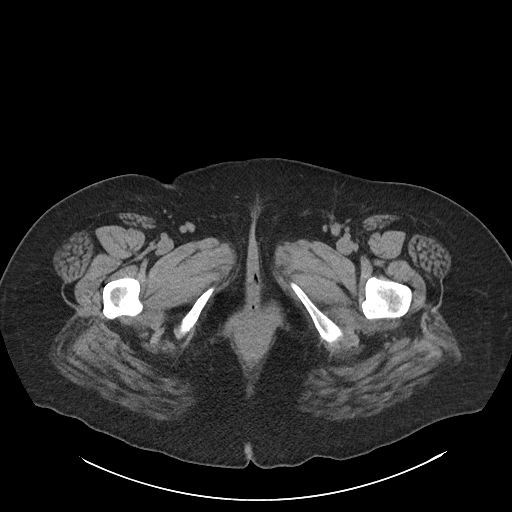
[im 26/114  soft-tissue]
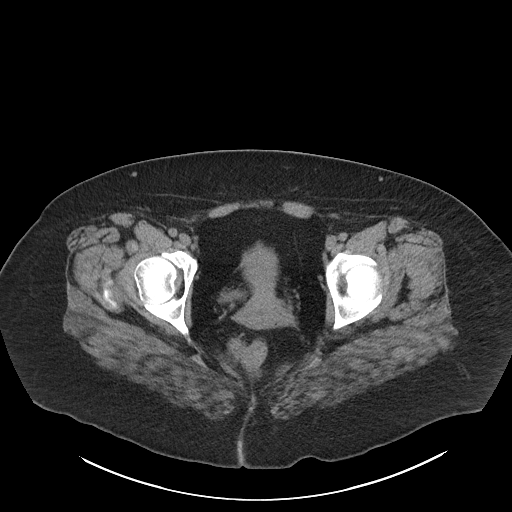
[im 32/114  soft-tissue]
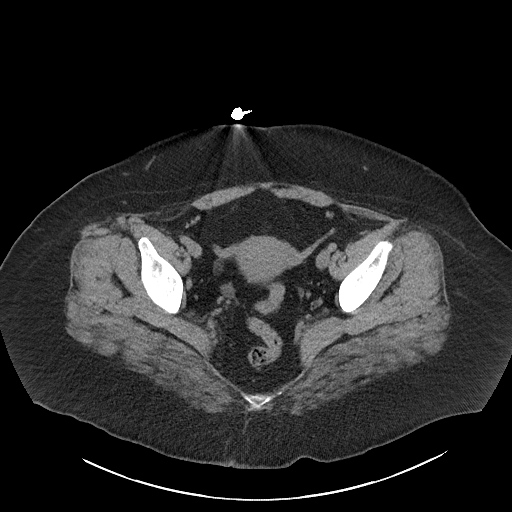
[im 38/114  soft-tissue]
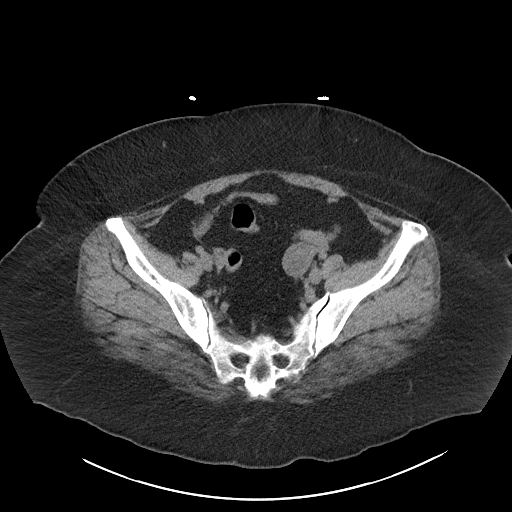
[im 51/114  soft-tissue]
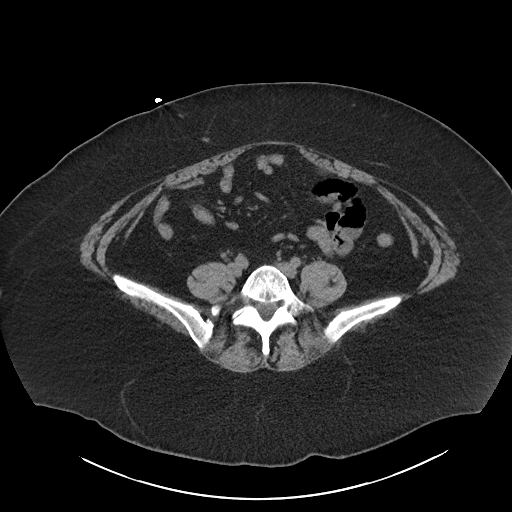
[im 57/114  soft-tissue]
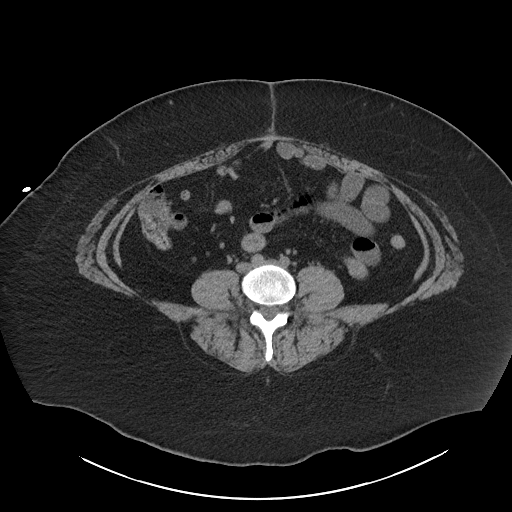
[im 63/114  soft-tissue]
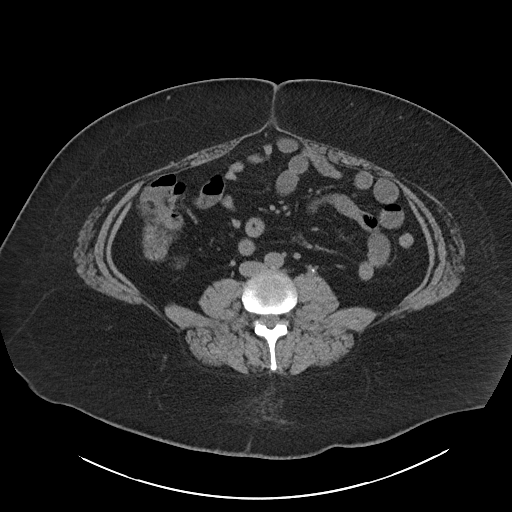
[im 76/114  soft-tissue]
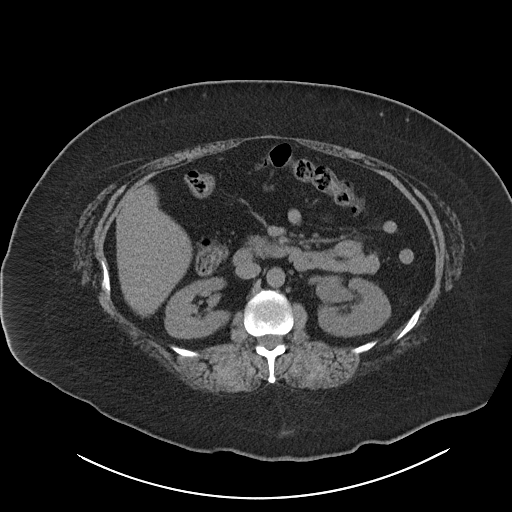
[im 76/114  bone]
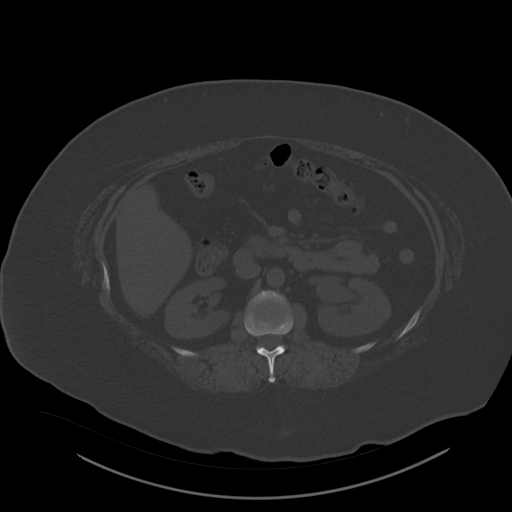
[im 82/114  soft-tissue]
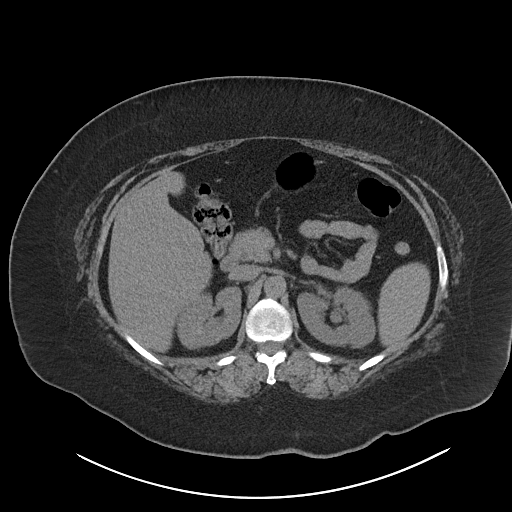
[im 88/114  soft-tissue]
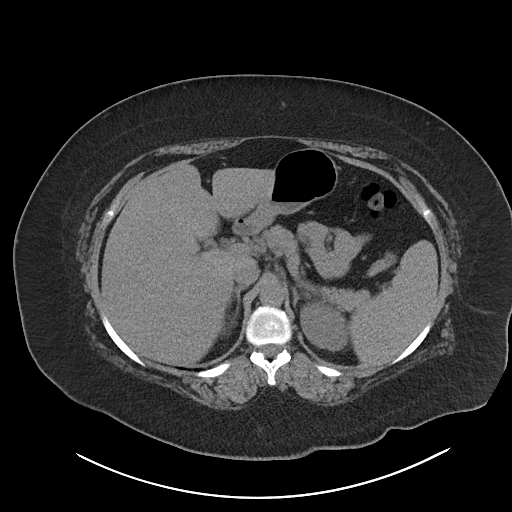
[im 101/114  soft-tissue]
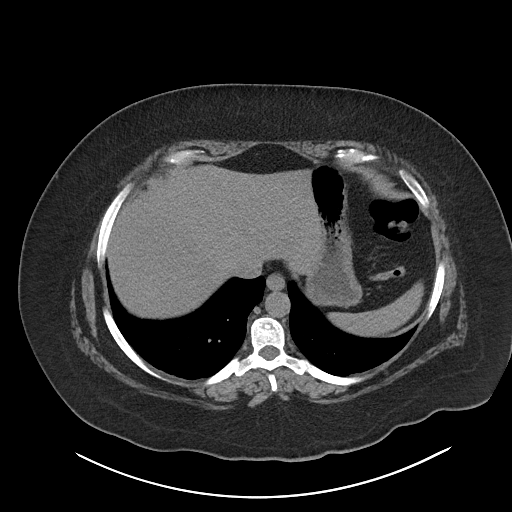
[im 107/114  soft-tissue]
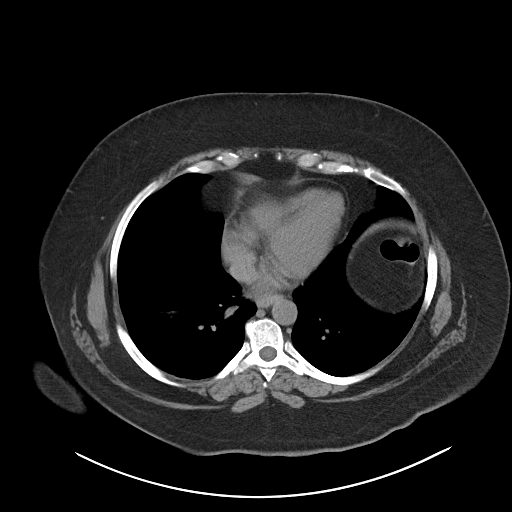

[Series 5: cor · coronal · 0.99mm/px · 3 of 122 slices shown]
[im 41/122  soft-tissue]
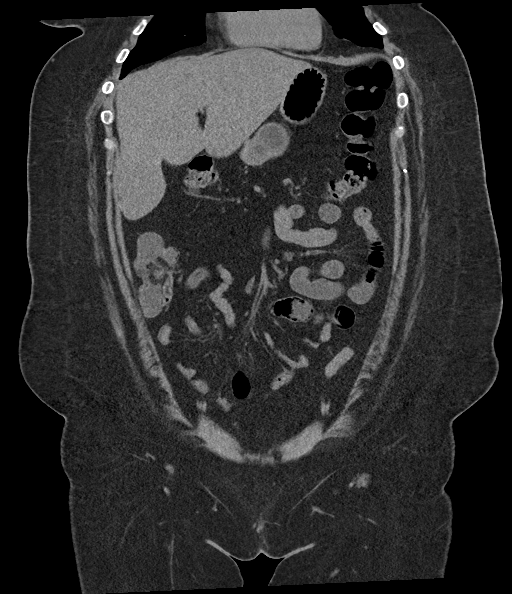
[im 54/122  soft-tissue]
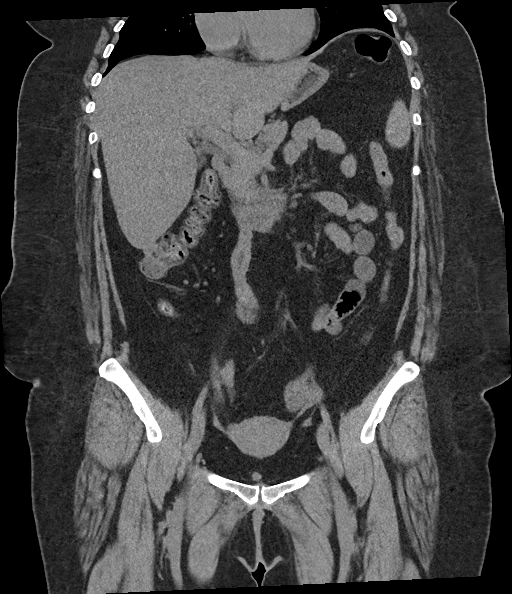
[im 68/122  soft-tissue]
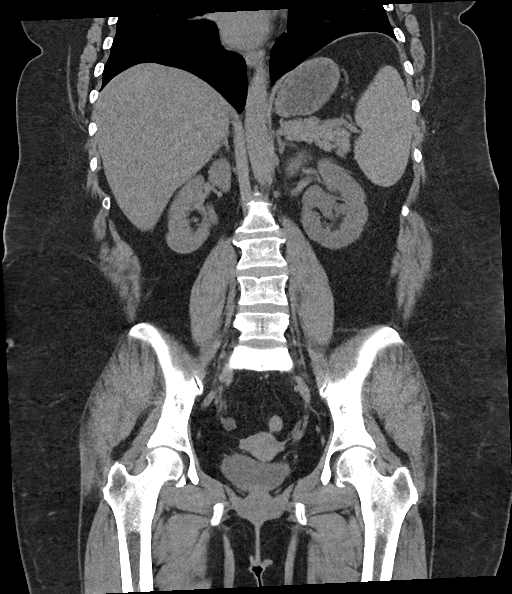

[16 of 46 positions shown; findings below may reference images not displayed]

FINDINGS: Lower chest: No acute findings.

Hepatobiliary: No mass visualized on this unenhanced exam. Prior
cholecystectomy. No evidence of biliary obstruction.

Pancreas: No mass or inflammatory process visualized on this
unenhanced exam.

Spleen:  Within normal limits in size.

Adrenals/Urinary tract: Mild left hydronephrosis is seen due to a 2
mm calculus in the proximal left ureter. 2 mm nonobstructing right
renal calculus noted.

Stomach/Bowel: No evidence of obstruction, inflammatory process, or
abnormal fluid collections.

Vascular/Lymphatic: No pathologically enlarged lymph nodes
identified. No evidence of abdominal aortic aneurysm.

Reproductive:  No mass or other significant abnormality.

Other:  None.

Musculoskeletal:  No suspicious bone lesions identified.
IMPRESSION: Mild left hydronephrosis due to 2 mm proximal left ureteral
calculus.

Tiny nonobstructing right renal calculus.
